# Patient Record
Sex: Female | Born: 2000
Health system: Southern US, Community
[De-identification: ages and names within clinical notes are randomized; demographics above are authoritative.]

## PROBLEM LIST (undated history)

## (undated) DIAGNOSIS — F419 Anxiety disorder, unspecified: Secondary | ICD-10-CM

## (undated) HISTORY — DX: Anxiety disorder, unspecified: F41.9

---
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
## 2021-08-27 ENCOUNTER — Ambulatory Visit: Payer: Medicaid Other

## 2021-08-28 ENCOUNTER — Other Ambulatory Visit: Payer: Self-pay

## 2021-08-28 ENCOUNTER — Ambulatory Visit (HOSPITAL_COMMUNITY)
Admission: EM | Admit: 2021-08-28 | Discharge: 2021-08-28 | Disposition: A | Discharge status: Home / Self Care | Payer: Medicaid Other | Attending: Emergency Medicine | Admitting: Emergency Medicine

## 2021-08-28 ENCOUNTER — Encounter (HOSPITAL_COMMUNITY): Payer: Self-pay | Admitting: Emergency Medicine

## 2021-08-28 DIAGNOSIS — R112 Nausea with vomiting, unspecified: Secondary | ICD-10-CM | POA: Diagnosis not present

## 2021-08-28 DIAGNOSIS — F1290 Cannabis use, unspecified, uncomplicated: Secondary | ICD-10-CM

## 2021-08-28 LAB — POCT URINALYSIS DIPSTICK, ED / UC
Bilirubin Urine: NEGATIVE
Glucose, UA: NEGATIVE mg/dL
Ketones, ur: NEGATIVE mg/dL
Leukocytes,Ua: NEGATIVE
Nitrite: NEGATIVE
Protein, ur: NEGATIVE mg/dL
Specific Gravity, Urine: 1.015 (ref 1.005–1.030)
Urobilinogen, UA: 0.2 mg/dL (ref 0.0–1.0)
pH: 7.5 (ref 5.0–8.0)

## 2021-08-28 LAB — CBG MONITORING, ED: Glucose-Capillary: 109 mg/dL — ABNORMAL HIGH (ref 70–99)

## 2021-08-28 LAB — POC URINE PREG, ED: Preg Test, Ur: NEGATIVE

## 2021-08-28 MED ORDER — ONDANSETRON HCL 4 MG PO TABS: 4.0000 mg | ORAL_TABLET | Freq: Four times a day (QID) | ORAL | 0 refills | Status: DC | PRN

## 2021-08-28 NOTE — ED Triage Notes (Signed)
Complains of weakness and nausea for 1 - 2 weeks.  Patient woke with headache today.

## 2021-08-28 NOTE — ED Provider Notes (Signed)
[facility]    CSN: [id number] Arrival date & time: 08/28/21  1548     History   Chief Complaint Chief Complaint  Patient presents with   Weakness    HPI Rebecca Reyes is a 21 y.o. female.  Presents with 2-week history of nausea and vomiting. Reports heavy daily marijuana use.  She has been trying to cut back recently. Constant nausea throughout the day, reports vomiting about 5 times over the past week.  Non bloody non-bilious.  Does not notice association with foods.  Some epigastric abdominal discomfort. Occasional weakness. She tried an aspirin for headache but threw it up.  Denies fever, chills, congestion, cough, sore throat, chest pain, shortness of breath, diarrhea/constipation, rash, urinary symptoms, vaginal bleeding or spotting. LMP ended 7/4. Pregnancy possible, she did take a test at home that was negative. Recently stopped taking her birth control pills  History reviewed. No pertinent past medical history.  Patient Active Problem List   Diagnosis Date Noted   Depression 03/01/2020    History reviewed. No pertinent surgical history.  OB History   No obstetric history on file.      Home Medications    Prior to Admission medications   Medication Sig Start Date End Date Taking? Authorizing Provider  ondansetron (ZOFRAN) 4 MG tablet Take 1 tablet (4 mg total) by mouth every 6 (six) hours as needed for nausea or vomiting. 08/28/21  Yes Niv Darley, Lurena Joiner, PA-C  escitalopram (LEXAPRO) 20 MG tablet Take 1 tablet (20 mg total) by mouth daily. Patient not taking: Reported on 10/19/2020 02/18/20   Grayce Sessions, NP    Family History History reviewed. No pertinent family history.  Social History Social History   Tobacco Use   Smoking status: Never   Smokeless tobacco: Never  Vaping Use   Vaping Use: Never used  Substance Use Topics   Alcohol use: No   Drug use: Yes    Types: Marijuana     Allergies   Patient has no known  allergies.   Review of Systems Review of Systems  Gastrointestinal:  Positive for nausea and vomiting.   Per HPI  Physical Exam Triage Vital Signs ED Triage Vitals  Enc Vitals Group     BP 08/28/21 1619 131/84     Pulse Rate 08/28/21 1619 92     Resp 08/28/21 1619 18     Temp 08/28/21 1619 98.4 F (36.9 C)     Temp Source 08/28/21 1619 Oral     SpO2 08/28/21 1619 98 %     Weight --      Height --      Head Circumference --      Peak Flow --      Pain Score 08/28/21 1616 4     Pain Loc --      Pain Edu? --      Excl. in GC? --    No data found.  Updated Vital Signs BP 131/84 (BP Location: Left Arm)   Pulse 92   Temp 98.4 F (36.9 C) (Oral)   Resp 18   LMP 08/10/2021   SpO2 98%    Physical Exam Vitals and nursing note reviewed.  Constitutional:      Appearance: Normal appearance.  HENT:     Mouth/Throat:     Mouth: Mucous membranes are moist.     Pharynx: Oropharynx is clear.  Eyes:     Conjunctiva/sclera: Conjunctivae normal.     Pupils: Pupils are equal, round,  and reactive to light.  Cardiovascular:     Rate and Rhythm: Normal rate and regular rhythm.     Heart sounds: Normal heart sounds.  Pulmonary:     Effort: Pulmonary effort is normal. No respiratory distress.     Breath sounds: Normal breath sounds.  Abdominal:     General: Bowel sounds are normal.     Palpations: Abdomen is soft.     Tenderness: There is abdominal tenderness in the epigastric area. There is no right CVA tenderness, left CVA tenderness, guarding or rebound. Negative signs include Murphy's sign, Rovsing's sign and McBurney's sign.     Comments: Some mild epigastric discomfort  Musculoskeletal:        General: Normal range of motion.  Skin:    General: Skin is warm and dry.  Neurological:     General: No focal deficit present.     Mental Status: She is alert and oriented to person, place, and time.     Motor: No weakness.     UC Treatments / Results  Labs (all labs  ordered are listed, but only abnormal results are displayed) Labs Reviewed  POCT URINALYSIS DIPSTICK, ED / UC - Abnormal; Notable for the following components:      Result Value   Hgb urine dipstick LARGE (*)    All other components within normal limits  CBG MONITORING, ED - Abnormal; Notable for the following components:   Glucose-Capillary 109 (*)    All other components within normal limits  POC URINE PREG, ED    EKG  Radiology No results found.  Procedures Procedures   Medications Ordered in UC Medications - No data to display  Initial Impression / Assessment and Plan / UC Course  I have reviewed the triage vital signs and the nursing notes.  Pertinent labs & imaging results that were available during my care of the patient were reviewed by me and considered in my medical decision making (see chart for details).  Blood glucose 109. Urinalysis negative apart from large hgb. Urine pregnancy negative.  Most likely cannabis hyperemesis syndrome. We discussed cutting back on marijuana use or take some days off and see if symptoms improve. Does not seem to be stomach bug/infectious cause at this time. No signs of acute abdomen. Try zofran at home as needed, ibuprofen if she has headache. Increase fluid intake as well. Follow up with primary care provider regarding symptoms and hematuria. Strict return/ED precautions.  Patient agrees to plan and she is discharged in stable condition.  Final Clinical Impressions(s) / UC Diagnoses   Final diagnoses:  Cannabinoid hyperemesis syndrome     Discharge Instructions      I believe your symptoms are due to heavy marijuana use. If you are able to stop smoking, you may see improvement in the nausea and vomiting.  In the meantime, increase your fluid intake. You can take the zofran every 6 hours for nausea. Take 30 min before a meal to help settle the stomach.  Please follow up with your primary care provider if symptoms persist. Any  worsening symptoms, please go to the emergency department.      ED Prescriptions     Medication Sig Dispense Auth. Provider   ondansetron (ZOFRAN) 4 MG tablet Take 1 tablet (4 mg total) by mouth every 6 (six) hours as needed for nausea or vomiting. 30 tablet Dail Meece, Lurena Joiner, PA-C      PDMP not reviewed this encounter.   Florena Kozma, Lurena Joiner, [place] 08/28/21 1709

## 2021-08-28 NOTE — Discharge Instructions (Addendum)
I believe your symptoms are due to heavy marijuana use. If you are able to stop smoking, you may see improvement in the nausea and vomiting.  In the meantime, increase your fluid intake. You can take the zofran every 6 hours for nausea. Take 30 min before a meal to help settle the stomach.  Please follow up with your primary care provider if symptoms persist. Any worsening symptoms, please go to the emergency department.

## 2021-08-31 ENCOUNTER — Encounter (INDEPENDENT_AMBULATORY_CARE_PROVIDER_SITE_OTHER): Payer: Self-pay | Admitting: Primary Care

## 2021-08-31 ENCOUNTER — Ambulatory Visit (INDEPENDENT_AMBULATORY_CARE_PROVIDER_SITE_OTHER): Payer: Medicaid Other | Admitting: Primary Care

## 2021-08-31 VITALS — BP 116/74 | HR 79 | Temp 98.1°F | Wt 157.0 lb

## 2021-08-31 DIAGNOSIS — R112 Nausea with vomiting, unspecified: Secondary | ICD-10-CM | POA: Diagnosis not present

## 2021-08-31 DIAGNOSIS — K5900 Constipation, unspecified: Secondary | ICD-10-CM | POA: Diagnosis not present

## 2021-08-31 DIAGNOSIS — Z3202 Encounter for pregnancy test, result negative: Secondary | ICD-10-CM | POA: Diagnosis not present

## 2021-08-31 LAB — POCT URINE PREGNANCY: Preg Test, Ur: NEGATIVE

## 2021-08-31 MED ORDER — SENNOSIDES 8.6 MG PO TABS: 1.0000 | ORAL_TABLET | Freq: Every day | ORAL | 1 refills | Status: DC

## 2021-08-31 NOTE — Patient Instructions (Addendum)
Constipation, Adult Constipation is when a person has fewer than three bowel movements in a week, has difficulty having a bowel movement, or has stools (feces) that are dry, hard, or larger than normal. Constipation may be caused by an underlying condition. It may become worse with age if a person takes certain medicines and does not take in enough fluids. Follow these instructions at home: Eating and drinking  Eat foods that have a lot of fiber, such as beans, whole grains, and fresh fruits and vegetables. Limit foods that are low in fiber and high in fat and processed sugars, such as fried or sweet foods. These include french fries, hamburgers, cookies, candies, and soda. Drink enough fluid to keep your urine pale yellow. General instructions Exercise regularly or as told by your health care provider. Try to do 150 minutes of moderate exercise each week. Use the bathroom when you have the urge to go. Do not hold it in. Take over-the-counter and prescription medicines only as told by your health care provider. This includes any fiber supplements. During bowel movements: Practice deep breathing while relaxing the lower abdomen. Practice pelvic floor relaxation. Watch your condition for any changes. Let your health care provider know about them. Keep all follow-up visits as told by your health care provider. This is important. Contact a health care provider if: You have pain that gets worse. You have a fever. You do not have a bowel movement after 4 days. You vomit. You are not hungry or you lose weight. You are bleeding from the opening between the buttocks (anus). You have thin, pencil-like stools. Get help right away if: You have a fever and your symptoms suddenly get worse. You leak stool or have blood in your stool. Your abdomen is bloated. You have severe pain in your abdomen. You feel dizzy or you faint. Summary Constipation is when a person has fewer than three bowel movements  in a week, has difficulty having a bowel movement, or has stools (feces) that are dry, hard, or larger than normal. Eat foods that have a lot of fiber, such as beans, whole grains, and fresh fruits and vegetables. Drink enough fluid to keep your urine pale yellow. Take over-the-counter and prescription medicines only as told by your health care provider. This includes any fiber supplements. This information is not intended to replace advice given to you by your health care provider. Make sure you discuss any questions you have with your health care provider. Document Revised: 12/18/2018 Document Reviewed: 12/18/2018 Elsevier Patient Education  2023 Else[employer] Inc. Nausea, Adult Nausea is feeling like you may vomit. Feeling like you may vomit is usually not serious, but it may be an early sign of a more serious medical problem. Vomiting is when stomach contents forcefully come out of your mouth. If you vomit, or if you are not able to drink enough fluids, you may not have enough water in your body (get dehydrated). If you do not have enough water in your body, you may: Feel tired. Feel thirsty. Have a dry mouth. Have cracked lips. Pee (urinate) less often. Older adults and people who have other diseases or a weak body defense system (immune system) have a higher risk of not having enough water in the body. The main goals of treating this condition are: To relieve your nausea. To ensure your nausea occurs less often. To prevent vomiting and losing too much fluid. Follow these instructions at home: Watch your symptoms for any changes. Tell your doctor about  them. Eating and drinking     Take an ORS (oral rehydration solution). This is a drink that is sold at pharmacies and stores. Drink clear fluids in small amounts as you are able. These include: Water. Ice chips. Fruit juice that has water added (diluted fruit juice). Low-calorie sports drinks. Eat bland, easy-to-digest foods in small  amounts as you are able, such as: Bananas. Applesauce. Rice. Low-fat (lean) meats. Toast. Crackers. Avoid drinking fluids that have a lot of sugar or caffeine in them. This includes energy drinks, sports drinks, and soda. Avoid alcohol. Avoid spicy or fatty foods. General instructions Take over-the-counter and prescription medicines only as told by your doctor. Rest at home while you get better. Drink enough fluid to keep your pee (urine) pale yellow. Take slow and deep breaths when you feel like you may vomit. Avoid food or things that have strong smells. Wash your hands often with soap and water for at least 20 seconds. If you cannot use soap and water, use hand sanitizer. Make sure that everyone in your home washes their hands well and often. Keep all follow-up visits. Contact a doctor if: You feel worse. You feel like you may vomit and this lasts for more than 2 days. You vomit. You are not able to drink fluids without vomiting. You have new symptoms. You have a fever. You have a headache. You have muscle cramps. You have a rash. You have pain while peeing. You feel light-headed or dizzy. Get help right away if: You have pain in your chest, neck, arm, or jaw. You feel very weak or you faint. You have vomit that is bright red or looks like coffee grounds. You have bloody or black poop (stools) or poop that looks like tar. You have a very bad headache, a stiff neck, or both. You have very bad pain, cramping, or bloating in your belly (abdomen). You have trouble breathing or you are breathing very quickly. Your heart is beating very quickly. Your skin feels cold and clammy. You feel confused. You have signs of losing too much water in your body, such as: Dark pee, very little pee, or no pee. Cracked lips. Dry mouth. Sunken eyes. Sleepiness. Weakness. These symptoms may be an emergency. Get help right away. Call 911. Do not wait to see if the symptoms will go  away. Do not drive yourself to the hospital. Summary Nausea is feeling like you are about vomit. If you vomit, or if you are not able to drink enough fluids, you may not have enough water in your body (get dehydrated). Eat and drink what your doctor tells you. Take over-the-counter and prescription medicines only as told by your doctor. Contact a doctor right away if your symptoms get worse or you have new symptoms. Keep all follow-up visits. This information is not intended to replace advice given to you by your health care provider. Make sure you discuss any questions you have with your health care provider. Document Revised: 08/06/2020 Document Reviewed: 08/06/2020 Elsevier Patient Education  2023 [employer].

## 2021-09-10 NOTE — Progress Notes (Signed)
[facility]  Dublin Cantero, is a 21 y.o. female  IHK:[id number]  VFI:[id number]  DOB - 2001/02/02  Chief Complaint  Patient presents with   Follow-up    Dizziness, feeling weak, migraines,vomiting, pain on left lower side x1 week       Subjective:   Nickola Lenig is a 21 y.o. female here today for a follow up visit. Patient has No headache, No chest pain, No abdominal pain - No Nausea, No new weakness tingling or numbness, No Cough - SOB.  No problems updated.  ALLERGIES: No Known Allergies  PAST MEDICAL HISTORY: History reviewed. No pertinent past medical history.  MEDICATIONS AT HOME: Prior to Admission medications   Medication Sig Start Date End Date Taking? Authorizing Provider  ELLA 30 MG tablet Take 1 tablet by mouth once. 08/23/21  Yes [provider]  senna (SENOKOT) 8.6 MG tablet Take 1 tablet (8.6 mg total) by mouth daily. 08/31/21  Yes Grayce Sessions, NP  escitalopram (LEXAPRO) 20 MG tablet Take 1 tablet (20 mg total) by mouth daily. Patient not taking: Reported on 10/19/2020 02/18/20   Grayce Sessions, NP  ondansetron (ZOFRAN) 4 MG tablet Take 1 tablet (4 mg total) by mouth every 6 (six) hours as needed for nausea or vomiting. Patient not taking: Reported on 08/31/2021 08/28/21   Rising, Lurena Joiner, PA-C    Objective:   Vitals:   08/31/21 1531  BP: 116/74  Pulse: 79  Temp: 98.1 F (36.7 C)  TempSrc: Oral  SpO2: 97%  Weight: 157 lb (71.2 kg)   Exam General appearance : Awake, alert, not in any distress. Speech Clear. Not toxic looking HEENT: Atraumatic and Normocephalic, pupils equally reactive to light and accomodation Neck: Supple, no JVD. No cervical lymphadenopathy.  Chest: Good air entry bilaterally, no added sounds  CVS: S1 S2 regular, no murmurs.  Abdomen: Bowel sounds present, Non tender and not distended with no gaurding, rigidity or rebound. Extremities: B/L Lower Ext shows no edema, both legs are warm to  touch Neurology: Awake alert, and oriented X 3, CN II-XII intact, Non focal Skin: No Rash  Data Review No results found for: "HGBA1C"  Assessment & Plan   1. Nausea and vomiting, unspecified vomiting type BRATS diet -bananas, rice apples, toast and soups this will lower easy digestion and stomach to relax. No red flags present. Diet discussed. Avoid fried, spicy, fatty, greasy, and acidic foods. Avoid caffeine, nicotine, and alcohol. Do not eat 2-3 hours before bedtime and stay upright for at least 1-2 hours after eating. Eat small frequent meals. Avoid NSAID's like motrin and aleve. Medications as prescribed. Report any new or worsening symptoms. Follow up as discussed or sooner if needed.    - POCT urine pregnancy Negative  2. Constipation, unspecified constipation type Information placed on AVS MANAGEMENT OF CHRONIC CONSTIPATION Drink fluids in the recommended amount everyday. Recommend amount is 8 cups of water daily. Do not replace water with Gatorade or Powerade as these should only be used when you are dehydrated.  Eat lots of high fiber foods-fruits, veggies, bran and whole grain instead of white bread Be active everyday. Inactivity makes constipation worse. Add psyllium daily (Metamucil) which comes in capsules now. Start very low dose and work up to recommended dose on bottle daily. Stay away from Milk of Magnesia or any magnesium containing laxative, unless you need it to clear things out rarely. It is an addictive laxative and your gut will become dependent on it. If that is  not working, I would start Miralax, which you can buy in generic 17 gms daily. It's a powder and not an "addictive laxative". Take it every day and titrate the dose up or down to get the daily Bm. We will consider the use of other pharmacological treatments should the above recommendations prove to be unsuccessful.   senna (SENOKOT) 8.6 MG      Patient have been counseled extensively about nutrition and  exercise. Other issues discussed during this visit include: low cholesterol diet, weight control and daily exercise, foot care, annual eye examinations at Ophthalmology, importance of adherence with medications and regular follow-up. We also discussed long term complications of uncontrolled diabetes and hypertension.   Return in about 6 weeks (around 10/12/2021) for depression .  The patient was given clear instructions to go to ER or return to medical center if symptoms don't improve, worsen or new problems develop. The patient verbalized understanding. The patient was told to call to get lab results if they haven't heard anything in the next week.   This note has been created with Education officer, environmental. Any transcriptional errors are unintentional.   Grayce Sessions, NP 09/10/2021, 4:52 PM

## 2021-09-29 ENCOUNTER — Emergency Department (HOSPITAL_COMMUNITY)
Admission: EM | Admit: 2021-09-29 | Discharge: 2021-09-29 | Discharge status: Against Medical Advice | Payer: Medicaid Other | Attending: Emergency Medicine | Admitting: Emergency Medicine

## 2021-09-29 ENCOUNTER — Encounter (HOSPITAL_COMMUNITY): Payer: Self-pay | Admitting: Emergency Medicine

## 2021-09-29 ENCOUNTER — Emergency Department (HOSPITAL_COMMUNITY): Payer: Medicaid Other

## 2021-09-29 DIAGNOSIS — Z5321 Procedure and treatment not carried out due to patient leaving prior to being seen by health care provider: Secondary | ICD-10-CM | POA: Insufficient documentation

## 2021-09-29 DIAGNOSIS — K5900 Constipation, unspecified: Secondary | ICD-10-CM | POA: Diagnosis not present

## 2021-09-29 DIAGNOSIS — R110 Nausea: Secondary | ICD-10-CM | POA: Diagnosis not present

## 2021-09-29 DIAGNOSIS — R0789 Other chest pain: Secondary | ICD-10-CM | POA: Insufficient documentation

## 2021-09-29 LAB — CBC
HCT: 42.9 % (ref 36.0–46.0)
Hemoglobin: 14.1 g/dL (ref 12.0–15.0)
MCH: 28.3 pg (ref 26.0–34.0)
MCHC: 32.9 g/dL (ref 30.0–36.0)
MCV: 86 fL (ref 80.0–100.0)
Platelets: 353 10*3/uL (ref 150–400)
RBC: 4.99 MIL/uL (ref 3.87–5.11)
RDW: 12.5 % (ref 11.5–15.5)
WBC: 3.9 10*3/uL — ABNORMAL LOW (ref 4.0–10.5)
nRBC: 0 % (ref 0.0–0.2)

## 2021-09-29 LAB — BASIC METABOLIC PANEL
Anion gap: 8 (ref 5–15)
BUN: 7 mg/dL (ref 6–20)
CO2: 26 mmol/L (ref 22–32)
Calcium: 10 mg/dL (ref 8.9–10.3)
Chloride: 103 mmol/L (ref 98–111)
Creatinine, Ser: 0.74 mg/dL (ref 0.44–1.00)
GFR, Estimated: >60 mL/min (ref >60)
Glucose, Bld: 95 mg/dL (ref 70–99)
Potassium: 4.1 mmol/L (ref 3.5–5.1)
Sodium: 137 mmol/L (ref 135–145)

## 2021-09-29 LAB — I-STAT BETA HCG BLOOD, ED (MC, WL, AP ONLY): I-stat hCG, quantitative: <5 m[IU]/mL (ref <5)

## 2021-09-29 LAB — TROPONIN I (HIGH SENSITIVITY): Troponin I (High Sensitivity): 3 ng/L (ref <18)

## 2021-09-29 NOTE — ED Triage Notes (Addendum)
Patient w/ left sided chest pain, intermittent for about a month however patient states when waking up the pain felt more constant and had to leave work. Patient describes it as a pressure. Denies SHOB. Patient endorses nausea today at work.  Aox4. No apparent distress at this time. Denies any new meds however took laxative yesterday because she initially thought this was constipation.

## 2021-09-29 NOTE — ED Provider Triage Note (Signed)
Emergency Medicine Provider Triage Evaluation Note  [name] , a 21 y.o. female  was evaluated in triage.  Pt complains of left lower chest pain for the past month. She denies any SOB or any trauma. She denies any exacerbating factors.  Review of Systems  Positive:  Negative:   Physical Exam  BP 127/81 (BP Location: Right Arm)   Pulse 71   Temp 98.7 F (37.1 C) (Oral)   Resp 16   SpO2 98%  Gen:   Awake, no distress   Resp:  Normal effort  MSK:   Moves extremities without difficulty  Other:  Chest non tender. NAD. Lungs are clear.  Medical Decision Making  Medically screening exam initiated at 1:59 PM.  Appropriate orders placed.  NARIAH MORGANO was informed that the remainder of the evaluation will be completed by another provider, this initial triage assessment does not replace that evaluation, and the importance of remaining in the ED until their evaluation is complete.  NAD. Vitals stable. Chest pain work up added.    Achille Rich, PA-C 09/29/21 1400

## 2021-09-29 NOTE — ED Notes (Signed)
Pt went home.

## 2021-10-12 ENCOUNTER — Ambulatory Visit (INDEPENDENT_AMBULATORY_CARE_PROVIDER_SITE_OTHER): Payer: Medicaid Other | Admitting: Primary Care

## 2021-10-12 ENCOUNTER — Encounter (INDEPENDENT_AMBULATORY_CARE_PROVIDER_SITE_OTHER): Payer: Self-pay | Admitting: Primary Care

## 2021-10-12 VITALS — BP 121/81 | HR 70 | Temp 98.5°F | Ht 61.0 in | Wt 164.2 lb

## 2021-10-12 DIAGNOSIS — F411 Generalized anxiety disorder: Secondary | ICD-10-CM | POA: Diagnosis not present

## 2021-10-12 DIAGNOSIS — R41840 Attention and concentration deficit: Secondary | ICD-10-CM

## 2021-10-12 MED ORDER — BUSPIRONE HCL 5 MG PO TABS: 5.0000 mg | ORAL_TABLET | Freq: Two times a day (BID) | ORAL | 1 refills | Status: DC

## 2021-10-12 NOTE — Progress Notes (Signed)
       [facility]  Subjective:    [name] is a 21 y.o. female who presents for new evaluation and treatment of anxiety disorder and sleep disturbance. She has the following anxiety symptoms: difficulty concentrating, insomnia, irritable, palpitations, psychomotor agitation, racing thoughts, and sweating. Onset of symptoms was approximately 3 years ago. Symptoms have been gradually worsening since that time. She denies current suicidal and homicidal ideation. Family history significant for depression and substance abuse. Risk factors: positive family history in  aunt. Maternal.Previous treatment includes  none .  She is also concerned that she has lack of concentration and unable to complete a task.  Examples were given was cleaning her house she will start cleaning the kitchen, then realize she needed to wash clothes, then start vacuuming or sweeping and mopping.  She has spent the whole day cleaning and house and not 1 room had been cleaning completely. Patient has No headache, No chest pain, No abdominal pain - No Nausea, No new weakness tingling or numbness, No Cough - shortness of breath   Review of Systems Pertinent items noted in HPI and remainder of comprehensive ROS otherwise negative.    Objective:  BP 121/81   Pulse 70   Temp 98.5 F (36.9 C) (Oral)   Ht 5\' 1"  (1.549 m)   Wt 164 lb 3.2 oz (74.5 kg)   LMP 09/17/2021 (Exact Date)   SpO2 98%   BMI 31.03 kg/m      Assessment:  Rebecca Reyes was seen today for follow-up.  Diagnoses and all orders for this visit:  Lack of concentration Evaluation for ADD -     Ambulatory referral to Psychiatry  Generalized anxiety disorder We discussed options for treatment of anxiety including therapy and/or medication. Discussed potential risks, expected benefits, possible side effects of the medicine. We also discussed how to take it correctly and dosing instructions. If she has any significant side effects to the  medicine, she is to stop it and call for advice. Instructed patient to contact office or on-call physician promptly should condition worsen or any new symptoms appear.   She was agreeable with this plan.  Prescribed BuSpar 5 mg twice daily information on AVS provided reevaluate in at least 6 weeks.    This note has been created with Watt Climes. Any transcriptional errors are unintentional.   Education officer, environmental, NP 10/12/2021, 11:53 AM

## 2021-10-12 NOTE — Patient Instructions (Signed)
Buspirone Tablets What is this medication? BUSPIRONE (byoo SPYE rone) treats anxiety. It works by balancing the levels of dopamine and serotonin in your brain, hormones that help regulate mood. This medicine may be used for other purposes; ask your health care provider or pharmacist if you have questions. COMMON BRAND NAME(S): BuSpar, Buspar Dividose What should I tell my care team before I take this medication? They need to know if you have any of these conditions: Kidney or liver disease An unusual or allergic reaction to buspirone, other medications, foods, dyes, or preservatives Pregnant or trying to get pregnant Breast-feeding How should I use this medication? Take this medication by mouth with a glass of water. Follow the directions on the prescription label. You may take this medication with or without food. To ensure that this medication always works the same way for you, you should take it either always with or always without food. Take your doses at regular intervals. Do not take your medication more often than directed. Do not stop taking except on the advice of your care team. Talk to your care team about the use of this medication in children. Special care may be needed. Overdosage: If you think you have taken too much of this medicine contact a poison control center or emergency room at once. NOTE: This medicine is only for you. Do not share this medicine with others. What if I miss a dose? If you miss a dose, take it as soon as you can. If it is almost time for your next dose, take only that dose. Do not take double or extra doses. What may interact with this medication? Do not take this medication with any of the following: Linezolid MAOIs like Carbex, Eldepryl, Marplan, Nardil, and Parnate Methylene blue Procarbazine This medication may also interact with the following: Diazepam Digoxin Diltiazem Erythromycin Grapefruit juice Haloperidol Medications for mental  depression or mood problems Medications for seizures like carbamazepine, phenobarbital and phenytoin Nefazodone Other medications for anxiety Rifampin Ritonavir Some antifungal medications like itraconazole, ketoconazole, and voriconazole Verapamil Warfarin This list may not describe all possible interactions. Give your health care provider a list of all the medicines, herbs, non-prescription drugs, or dietary supplements you use. Also tell them if you smoke, drink alcohol, or use illegal drugs. Some items may interact with your medicine. What should I watch for while using this medication? Visit your care team for regular checks on your progress. It may take 1 to 2 weeks before your anxiety gets better. You may get drowsy or dizzy. Do not drive, use machinery, or do anything that needs mental alertness until you know how this medication affects you. Do not stand or sit up quickly, especially if you are an older patient. This reduces the risk of dizzy or fainting spells. Alcohol can make you more drowsy and dizzy. Avoid alcoholic drinks. What side effects may I notice from receiving this medication? Side effects that you should report to your care team as soon as possible: Allergic reactions--skin rash, itching, hives, swelling of the face, lips, tongue, or throat Irritability, confusion, fast or irregular heartbeat, muscle stiffness, twitching muscles, sweating, high fever, seizure, chills, vomiting, diarrhea, which may be signs of serotonin syndrome Side effects that usually do not require medical attention (report to your care team if they continue or are bothersome): Anxiety or nervousness Dizziness Drowsiness Headache Nausea Trouble sleeping This list may not describe all possible side effects. Call your doctor for medical advice about side effects. You may report   side effects to FDA at 1-800-FDA-1088. Where should I keep my medication? Keep out of the reach of children. Store at room  temperature below 30 degrees C (86 degrees F). Protect from light. Keep container tightly closed. Throw away any unused medication after the expiration date. NOTE: This sheet is a summary. It may not cover all possible information. If you have questions about this medicine, talk to your doctor, pharmacist, or health care provider.  2023 Elsevier/Gold Standard (2020-04-12 00:00:00)  

## 2021-10-21 ENCOUNTER — Encounter (INDEPENDENT_AMBULATORY_CARE_PROVIDER_SITE_OTHER): Payer: Medicaid Other | Admitting: Primary Care

## 2021-11-23 ENCOUNTER — Ambulatory Visit (INDEPENDENT_AMBULATORY_CARE_PROVIDER_SITE_OTHER): Payer: Medicaid Other | Admitting: Primary Care

## 2021-12-07 ENCOUNTER — Ambulatory Visit (HOSPITAL_COMMUNITY): Payer: Self-pay | Admitting: Student

## 2022-01-03 ENCOUNTER — Ambulatory Visit (INDEPENDENT_AMBULATORY_CARE_PROVIDER_SITE_OTHER): Payer: Medicaid Other | Admitting: Primary Care

## 2022-01-04 ENCOUNTER — Ambulatory Visit (HOSPITAL_COMMUNITY): Payer: Medicaid Other | Admitting: Student

## 2022-01-26 ENCOUNTER — Ambulatory Visit (INDEPENDENT_AMBULATORY_CARE_PROVIDER_SITE_OTHER): Payer: Medicaid Other | Admitting: Primary Care
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
## 2022-01-26 NOTE — Patient Instructions (Signed)
Thank you for attending your appointment today.  -- START Effexor 37.5 mg daily -- START Atarax 12.5-25 mg up to twice daily as needed for anxiety or sleep -- Continue other medications as prescribed.  Please do not make any changes to medications without first discussing with your provider. If you are experiencing a psychiatric emergency, please call 911 or present to your nearest emergency department. Additional crisis, medication management, and therapy resources are included below.  [facility]  [address] [place], [place] [address] [phone] WALK-IN URGENT CARE 24/7 FOR ANYONE [address], [place], [place]  [phone] Fax: [phone] [email or web address] *Interpreters available *Accepts all insurance and uninsured for Urgent Care needs *Accepts Medicaid and uninsured for outpatient treatment (below)      ONLY FOR [facility]  Below:    Outpatient New Patient Assessment/Therapy Walk-ins:        Monday -Thursday 8am until slots are full.        Every Friday 1pm-4pm  (first come, first served)                   New Patient Psychiatry/Medication Management        Monday-Friday 8am-11am (first come, first served)               For all walk-ins we ask that you arrive by 7:15am, because patients will be seen in the order of arrival.

## 2022-01-27 ENCOUNTER — Ambulatory Visit (INDEPENDENT_AMBULATORY_CARE_PROVIDER_SITE_OTHER): Payer: No Payment, Other | Admitting: Psychiatry

## 2022-01-27 ENCOUNTER — Encounter (HOSPITAL_COMMUNITY): Payer: Self-pay | Admitting: Psychiatry

## 2022-01-27 ENCOUNTER — Other Ambulatory Visit: Payer: Self-pay

## 2022-01-27 DIAGNOSIS — F411 Generalized anxiety disorder: Secondary | ICD-10-CM | POA: Diagnosis not present

## 2022-01-27 DIAGNOSIS — F410 Panic disorder [episodic paroxysmal anxiety] without agoraphobia: Secondary | ICD-10-CM | POA: Diagnosis not present

## 2022-01-27 MED ORDER — VENLAFAXINE HCL ER 37.5 MG PO CP24
37.5000 mg | ORAL_CAPSULE | Freq: Every day | ORAL | 2 refills | Status: DC
  Filled 2022-01-27: qty 30, 30d supply, fill #0

## 2022-01-27 MED ORDER — HYDROXYZINE HCL 25 MG PO TABS
12.5000 mg | ORAL_TABLET | Freq: Two times a day (BID) | ORAL | 2 refills | Status: DC | PRN
  Filled 2022-01-27: qty 60, 30d supply, fill #0

## 2022-02-02 ENCOUNTER — Encounter (INDEPENDENT_AMBULATORY_CARE_PROVIDER_SITE_OTHER): Payer: Self-pay | Admitting: Primary Care

## 2022-02-02 ENCOUNTER — Ambulatory Visit (INDEPENDENT_AMBULATORY_CARE_PROVIDER_SITE_OTHER): Payer: Self-pay | Admitting: Primary Care

## 2022-02-02 VITALS — BP 126/84 | HR 98 | Ht 61.0 in | Wt 165.8 lb

## 2022-02-02 DIAGNOSIS — Z2821 Immunization not carried out because of patient refusal: Secondary | ICD-10-CM

## 2022-02-02 DIAGNOSIS — M25561 Pain in right knee: Secondary | ICD-10-CM

## 2022-02-02 DIAGNOSIS — E6609 Other obesity due to excess calories: Secondary | ICD-10-CM

## 2022-02-02 DIAGNOSIS — Z6830 Body mass index (BMI) 30.0-30.9, adult: Secondary | ICD-10-CM

## 2022-02-02 NOTE — Progress Notes (Signed)
Right knee pain up the sciatica. Otc Ibprofen 500mg  x2 daily.

## 2022-02-02 NOTE — Patient Instructions (Signed)
Calorie Counting for Weight Loss Calories are units of energy. Your body needs a certain number of calories from food to keep going throughout the day. When you eat or drink more calories than your body needs, your body stores the extra calories mostly as fat. When you eat or drink fewer calories than your body needs, your body burns fat to get the energy it needs. Calorie counting means keeping track of how many calories you eat and drink each day. Calorie counting can be helpful if you need to lose weight. If you eat fewer calories than your body needs, you should lose weight. Ask your health care provider what a healthy weight is for you. For calorie counting to work, you will need to eat the right number of calories each day to lose a healthy amount of weight per week. A dietitian can help you figure out how many calories you need in a day and will suggest ways to reach your calorie goal. A healthy amount of weight to lose each week is usually 1-2 lb (0.5-0.9 kg). This usually means that your daily calorie intake should be reduced by 500-750 calories. Eating 1,200-1,500 calories a day can help most women lose weight. Eating 1,500-1,800 calories a day can help most men lose weight. What do I need to know about calorie counting? Work with your health care provider or dietitian to determine how many calories you should get each day. To meet your daily calorie goal, you will need to: Find out how many calories are in each food that you would like to eat. Try to do this before you eat. Decide how much of the food you plan to eat. Keep a food log. Do this by writing down what you ate and how many calories it had. To successfully lose weight, it is important to balance calorie counting with a healthy lifestyle that includes regular activity. Where do I find calorie information?  The number of calories in a food can be found on a Nutrition Facts label. If a food does not have a Nutrition Facts label, try  to look up the calories online or ask your dietitian for help. Remember that calories are listed per serving. If you choose to have more than one serving of a food, you will have to multiply the calories per serving by the number of servings you plan to eat. For example, the label on a package of bread might say that a serving size is 1 slice and that there are 90 calories in a serving. If you eat 1 slice, you will have eaten 90 calories. If you eat 2 slices, you will have eaten 180 calories. How do I keep a food log? After each time that you eat, record the following in your food log as soon as possible: What you ate. Be sure to include toppings, sauces, and other extras on the food. How much you ate. This can be measured in cups, ounces, or number of items. How many calories were in each food and drink. The total number of calories in the food you ate. Keep your food log near you, such as in a pocket-sized notebook or on an app or website on your mobile phone. Some programs will calculate calories for you and show you how many calories you have left to meet your daily goal. What are some portion-control tips? Know how many calories are in a serving. This will help you know how many servings you can have of a certain   food. Use a measuring cup to measure serving sizes. You could also try weighing out portions on a kitchen scale. With time, you will be able to estimate serving sizes for some foods. Take time to put servings of different foods on your favorite plates or in your favorite bowls and cups so you know what a serving looks like. Try not to eat straight from a food's packaging, such as from a bag or box. Eating straight from the package makes it hard to see how much you are eating and can lead to overeating. Put the amount you would like to eat in a cup or on a plate to make sure you are eating the right portion. Use smaller plates, glasses, and bowls for smaller portions and to prevent  overeating. Try not to multitask. For example, avoid watching TV or using your computer while eating. If it is time to eat, sit down at a table and enjoy your food. This will help you recognize when you are full. It will also help you be more mindful of what and how much you are eating. What are tips for following this plan? Reading food labels Check the calorie count compared with the serving size. The serving size may be smaller than what you are used to eating. Check the source of the calories. Try to choose foods that are high in protein, fiber, and vitamins, and low in saturated fat, trans fat, and sodium. Shopping Read nutrition labels while you shop. This will help you make healthy decisions about which foods to buy. Pay attention to nutrition labels for low-fat or fat-free foods. These foods sometimes have the same number of calories or more calories than the full-fat versions. They also often have added sugar, starch, or salt to make up for flavor that was removed with the fat. Make a grocery list of lower-calorie foods and stick to it. Cooking Try to cook your favorite foods in a healthier way. For example, try baking instead of frying. Use low-fat dairy products. Meal planning Use more fruits and vegetables. One-half of your plate should be fruits and vegetables. Include lean proteins, such as chicken, turkey, and fish. Lifestyle Each week, aim to do one of the following: 150 minutes of moderate exercise, such as walking. 75 minutes of vigorous exercise, such as running. General information Know how many calories are in the foods you eat most often. This will help you calculate calorie counts faster. Find a way of tracking calories that works for you. Get creative. Try different apps or programs if writing down calories does not work for you. What foods should I eat?  Eat nutritious foods. It is better to have a nutritious, high-calorie food, such as an avocado, than a food with  few nutrients, such as a bag of potato chips. Use your calories on foods and drinks that will fill you up and will not leave you hungry soon after eating. Examples of foods that fill you up are nuts and nut butters, vegetables, lean proteins, and high-fiber foods such as whole grains. High-fiber foods are foods with more than 5 g of fiber per serving. Pay attention to calories in drinks. Low-calorie drinks include water and unsweetened drinks. The items listed above may not be a complete list of foods and beverages you can eat. Contact a dietitian for more information. What foods should I limit? Limit foods or drinks that are not good sources of vitamins, minerals, or protein or that are high in unhealthy fats. These   include: Candy. Other sweets. Sodas, specialty coffee drinks, alcohol, and juice. The items listed above may not be a complete list of foods and beverages you should avoid. Contact a dietitian for more information. How do I count calories when eating out? Pay attention to portions. Often, portions are much larger when eating out. Try these tips to keep portions smaller: Consider sharing a meal instead of getting your own. If you get your own meal, eat only half of it. Before you start eating, ask for a container and put half of your meal into it. When available, consider ordering smaller portions from the menu instead of full portions. Pay attention to your food and drink choices. Knowing the way food is cooked and what is included with the meal can help you eat fewer calories. If calories are listed on the menu, choose the lower-calorie options. Choose dishes that include vegetables, fruits, whole grains, low-fat dairy products, and lean proteins. Choose items that are boiled, broiled, grilled, or steamed. Avoid items that are buttered, battered, fried, or served with cream sauce. Items labeled as crispy are usually fried, unless stated otherwise. Choose water, low-fat milk,  unsweetened iced tea, or other drinks without added sugar. If you want an alcoholic beverage, choose a lower-calorie option, such as a glass of wine or light beer. Ask for dressings, sauces, and syrups on the side. These are usually high in calories, so you should limit the amount you eat. If you want a salad, choose a garden salad and ask for grilled meats. Avoid extra toppings such as bacon, cheese, or fried items. Ask for the dressing on the side, or ask for olive oil and vinegar or lemon to use as dressing. Estimate how many servings of a food you are given. Knowing serving sizes will help you be aware of how much food you are eating at restaurants. Where to find more information Centers for Disease Control and Prevention: www.cdc.gov U.[place]. Department of Agriculture: myplate.gov Summary Calorie counting means keeping track of how many calories you eat and drink each day. If you eat fewer calories than your body needs, you should lose weight. A healthy amount of weight to lose per week is usually 1-2 lb (0.5-0.9 kg). This usually means reducing your daily calorie intake by 500-750 calories. The number of calories in a food can be found on a Nutrition Facts label. If a food does not have a Nutrition Facts label, try to look up the calories online or ask your dietitian for help. Use smaller plates, glasses, and bowls for smaller portions and to prevent overeating. Use your calories on foods and drinks that will fill you up and not leave you hungry shortly after a meal. This information is not intended to replace advice given to you by your health care provider. Make sure you discuss any questions you have with your health care provider. Document Revised: 03/13/2019 Document Reviewed: 03/13/2019 Elsevier Patient Education  2023 Elsevier Inc.  

## 2022-02-02 NOTE — Progress Notes (Signed)
    [facility]          Subjective:    Rebecca Reyes is a 21 y.o. female who presents with knee pain involving the right knee. Onset was gradual, starting about 6 months ago. Inciting event: none known but aggravating factor standing for prolong periods of time. Current symptoms include: crepitus sensation, giving out, and popping sensation. Pain is aggravated by kneeling, pivoting, rising after sitting, squatting, standing, and walking. Patient has had no prior knee problems. Evaluation to date: none. Treatment to date: avoidance of offending activity, ice, and OTC analgesics which are somewhat effective.  The following portions of the patient's history were reviewed and updated as appropriate: allergies, current medications, past family history, past medical history, past social history, past surgical history, and problem list.   Review of Systems Pertinent items noted in HPI and remainder of comprehensive ROS otherwise negative.   Objective:    Blood Pressure 126/84   Pulse 98   Height 5\' 1"  (1.549 m)   Weight 165 lb 12.8 oz (75.2 kg)   Last Menstrual Period 01/28/2022 (Exact Date)   Oxygen Saturation 98%   Body Mass Index 31.33 kg/m  Physical exam: General: Vital signs reviewed.  Patient is well-developed and well-nourished, obese female  in no acute distress and cooperative with exam. Head: Normocephalic and atraumatic. Eyes: EOMI, conjunctivae normal, no scleral icterus. Neck: Supple, trachea midline, normal ROM, no JVD, masses, thyromegaly, or carotid bruit present. Cardiovascular: RRR, S1 normal, S2 normal, no murmurs, gallops, or rubs. Pulmonary/Chest: Clear to auscultation bilaterally, no wheezes, rales, or rhonchi. Abdominal: Soft, non-tender, non-distended, BS +, no masses, organomegaly, or guarding present. Musculoskeletal Extremities:Left knee negative exam and right knee positive for crepitus  Neurological: A&O x3, Strength is normal Skin:  Warm, dry and intact. No rashes or erythema. Psychiatric: Normal mood and affect. speech and behavior is normal. Cognition and memory are normal.    Assessment:   Rebecca Reyes was seen today for knee pain.  Diagnoses and all orders for this visit:  Human papilloma virus (HPV) vaccination declined  Right knee pain, unspecified chronicity Right knee pain up the sciatica.  Contributory factors may be weight encouraged weight loss and exercising.  May use over-the-counter ibuprofen 200 mg x 2 every 6-8 hours for pain as needed  Class 1 obesity due to excess calories without serious comorbidity with body mass index (BMI) of 30.0 to 30.9 in adult Obesity is 30-39 indicating an excess in caloric intake or underlining conditions. This may lead to other co-morbidities. Educated on lifestyle modifications of diet and exercise which may reduce obesity.      This note has been created with Watt Climes. Any transcriptional errors are unintentional.   Education officer, environmental, NP 02/02/2022, 4:24 PM

## 2022-02-08 ENCOUNTER — Ambulatory Visit: Payer: Medicaid Other | Admitting: Nurse Practitioner

## 2022-02-16 ENCOUNTER — Other Ambulatory Visit: Payer: Self-pay

## 2022-02-16 ENCOUNTER — Ambulatory Visit (INDEPENDENT_AMBULATORY_CARE_PROVIDER_SITE_OTHER): Payer: No Payment, Other | Admitting: Student

## 2022-02-16 ENCOUNTER — Encounter (HOSPITAL_COMMUNITY): Payer: Self-pay | Admitting: Student

## 2022-02-16 VITALS — BP 119/77 | HR 71 | Wt 162.4 lb

## 2022-02-16 DIAGNOSIS — F410 Panic disorder [episodic paroxysmal anxiety] without agoraphobia: Secondary | ICD-10-CM | POA: Diagnosis not present

## 2022-02-16 DIAGNOSIS — F411 Generalized anxiety disorder: Secondary | ICD-10-CM

## 2022-02-16 MED ORDER — VENLAFAXINE HCL ER 75 MG PO CP24
75.0000 mg | ORAL_CAPSULE | Freq: Every day | ORAL | 1 refills | Status: DC
  Filled 2022-02-16: qty 30, 30d supply, fill #0
  Filled 2022-04-01: qty 30, 30d supply, fill #1

## 2022-02-16 MED ORDER — HYDROXYZINE HCL 10 MG PO TABS
10.0000 mg | ORAL_TABLET | Freq: Three times a day (TID) | ORAL | 2 refills | Status: DC | PRN
  Filled 2022-02-16: qty 60, 20d supply, fill #0
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
## 2022-02-17 ENCOUNTER — Other Ambulatory Visit: Payer: Self-pay

## 2022-02-21 ENCOUNTER — Ambulatory Visit (INDEPENDENT_AMBULATORY_CARE_PROVIDER_SITE_OTHER): Payer: No Payment, Other | Admitting: Clinical

## 2022-02-21 DIAGNOSIS — F410 Panic disorder [episodic paroxysmal anxiety] without agoraphobia: Secondary | ICD-10-CM

## 2022-02-21 DIAGNOSIS — F411 Generalized anxiety disorder: Secondary | ICD-10-CM | POA: Diagnosis not present
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
## 2022-02-23 ENCOUNTER — Ambulatory Visit (INDEPENDENT_AMBULATORY_CARE_PROVIDER_SITE_OTHER): Payer: Self-pay | Admitting: Primary Care

## 2022-02-23 ENCOUNTER — Other Ambulatory Visit: Payer: Self-pay

## 2022-03-12 ENCOUNTER — Ambulatory Visit (INDEPENDENT_AMBULATORY_CARE_PROVIDER_SITE_OTHER): Payer: Self-pay

## 2022-03-12 ENCOUNTER — Encounter: Payer: Self-pay | Admitting: Emergency Medicine

## 2022-03-12 ENCOUNTER — Ambulatory Visit
Admission: EM | Admit: 2022-03-12 | Discharge: 2022-03-12 | Disposition: A | Discharge status: Home / Self Care | Payer: Self-pay | Attending: Nurse Practitioner | Admitting: Nurse Practitioner

## 2022-03-12 DIAGNOSIS — R112 Nausea with vomiting, unspecified: Secondary | ICD-10-CM | POA: Insufficient documentation

## 2022-03-12 DIAGNOSIS — Z1152 Encounter for screening for COVID-19: Secondary | ICD-10-CM | POA: Insufficient documentation

## 2022-03-12 DIAGNOSIS — R1084 Generalized abdominal pain: Secondary | ICD-10-CM

## 2022-03-12 DIAGNOSIS — N3000 Acute cystitis without hematuria: Secondary | ICD-10-CM | POA: Insufficient documentation

## 2022-03-12 DIAGNOSIS — K5900 Constipation, unspecified: Secondary | ICD-10-CM | POA: Insufficient documentation

## 2022-03-12 LAB — POCT URINALYSIS DIP (MANUAL ENTRY)
Bilirubin, UA: NEGATIVE
Blood, UA: NEGATIVE
Glucose, UA: NEGATIVE mg/dL
Ketones, POC UA: NEGATIVE mg/dL
Nitrite, UA: NEGATIVE
Spec Grav, UA: >=1.03 — AB (ref 1.010–1.025)
Urobilinogen, UA: 1 E.U./dL
pH, UA: 6 (ref 5.0–8.0)

## 2022-03-12 LAB — POCT URINE PREGNANCY: Preg Test, Ur: NEGATIVE

## 2022-03-12 LAB — POCT FASTING CBG KUC MANUAL ENTRY: POCT Glucose (KUC): 82 mg/dL (ref 70–99)

## 2022-03-12 MED ORDER — CEPHALEXIN 500 MG PO CAPS: 500.0000 mg | ORAL_CAPSULE | Freq: Two times a day (BID) | ORAL | 0 refills | Status: AC

## 2022-03-12 MED ORDER — ONDANSETRON HCL 4 MG PO TABS: 4.0000 mg | ORAL_TABLET | Freq: Three times a day (TID) | ORAL | 0 refills | Status: DC | PRN

## 2022-03-12 NOTE — ED Triage Notes (Signed)
Patient c/o blood sugars running low since Wednesday.  When patient does eat, she feels worse, vomiting.  Last blood sugar checked was on Wednesday, it was 74 non-fasting.

## 2022-03-12 NOTE — Discharge Instructions (Signed)
Keflex twice daily for 7 days.  I will culture your urine and contact you if those results are positive Zofran as needed for nausea/vomiting.  Please stay hydrated with over-the-counter Gatorade, Powerade, Pedialyte.  Brat diet (bread rice applesauce toast bananas) and advance as tolerated Take over-the-counter laxative such as MiraLAX daily. Follow-up with your PCP in 2 days for recheck Please go to the emergency room if you have any worsening symptoms

## 2022-03-12 NOTE — ED Provider Notes (Signed)
[facility]    CSN: [id number] Arrival date & time: 03/12/22  0845      History   Chief Complaint Chief Complaint  Patient presents with   Glucose Low    HPI Rebecca Reyes is a 22 y.o. female presents for evaluation of states/vomiting, dizziness, and fatigue.  Patient reports 5 days of intermittent nausea with no more than 2 episodes of vomiting per day that occurs only after eating.  She does endorse some dizziness that occurs after her vomiting episodes but denies any syncope.  Denies any fevers, chills, URI symptoms, body aches, diarrhea.  She does have history of constipation but does not remember the last time she had a bowel movement.  States she is able to stay hydrated and keep fluids down.  No dysuria.  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  When her symptoms first began at work she did have them check her blood sugar and states it was 74.  No history of diabetes or hypoglycemia.  She has not taken any OTC medications no other concerns at this time.  HPI  Past Medical History:  Diagnosis Date   Anxiety     Patient Active Problem List   Diagnosis Date Noted   Generalized anxiety disorder with panic attacks 01/27/2022    History reviewed. No pertinent surgical history.  OB History   No obstetric history on file.      Home Medications    Prior to Admission medications   Medication Sig Start Date End Date Taking? Authorizing Provider  cephALEXin (KEFLEX) 500 MG capsule Take 1 capsule (500 mg total) by mouth 2 (two) times daily for 7 days. 03/12/22 03/19/22 Yes Melynda Ripple, NP  hydrOXYzine (ATARAX) 10 MG tablet Take 1 tablet (10 mg total) by mouth every 8 (eight) hours as needed for anxiety (or sleep). 02/16/22  Yes France Ravens, MD  ondansetron (ZOFRAN) 4 MG tablet Take 1 tablet (4 mg total) by mouth every 8 (eight) hours as needed for nausea or vomiting. 03/12/22  Yes Melynda Ripple, NP   venlafaxine XR (EFFEXOR XR) 75 MG 24 hr capsule Take 1 capsule (75 mg total) by mouth daily with breakfast. 02/16/22 04/17/22 Yes France Ravens, MD    Family History Family History  Problem Relation Age of Onset   Bipolar disorder Maternal Aunt    Schizophrenia Maternal Aunt    Drug abuse Maternal Aunt    Bipolar disorder Maternal Uncle    Bipolar disorder Maternal Grandmother    Schizophrenia Maternal Grandmother     Social History Social History   Tobacco Use   Smoking status: Never   Smokeless tobacco: Never  Vaping Use   Vaping Use: Never used  Substance Use Topics   Alcohol use: No   Drug use: Not Currently    Types: Marijuana     Allergies   Patient has no known allergies.   Review of Systems Review of Systems  Constitutional:  Positive for fatigue.  Gastrointestinal:  Positive for abdominal pain, constipation, nausea and vomiting.  Neurological:  Positive for dizziness.     Physical Exam Triage Vital Signs ED Triage Vitals  Enc Vitals Group     BP 03/12/22 0855 116/80     Pulse Rate 03/12/22 0855 99     Resp 03/12/22 0855 18     Temp 03/12/22 0855 97.9 F (36.6 C)     Temp Source 03/12/22 0855 Oral  SpO2 03/12/22 0855 96 %     Weight 03/12/22 0857 165 lb (74.8 kg)     Height 03/12/22 0857 5' (1.524 m)     Head Circumference --      Peak Flow --      Pain Score 03/12/22 0856 5     Pain Loc --      Pain Edu? --      Excl. in [place]? --    No data found.  Updated Vital Signs BP 116/80 (BP Location: Right Arm)   Pulse 99   Temp 97.9 F (36.6 C) (Oral)   Resp 18   Ht 5' (1.524 m)   Wt 165 lb (74.8 kg)   LMP 02/17/2022 (Exact Date)   SpO2 96%   BMI 32.22 kg/m   Visual Acuity Right Eye Distance:   Left Eye Distance:   Bilateral Distance:    Right Eye Near:   Left Eye Near:    Bilateral Near:     Physical Exam Vitals and nursing note reviewed.  Constitutional:      General: She is not in acute distress.    Appearance: Normal  appearance. She is not ill-appearing, toxic-appearing or diaphoretic.  HENT:     Head: Normocephalic and atraumatic.     Right Ear: Tympanic membrane and ear canal normal.     Left Ear: Tympanic membrane and ear canal normal.     Nose: Nose normal.  Eyes:     Extraocular Movements: Extraocular movements intact.     Pupils: Pupils are equal, round, and reactive to light.  Cardiovascular:     Rate and Rhythm: Normal rate and regular rhythm.     Heart sounds: Normal heart sounds.  Pulmonary:     Effort: Pulmonary effort is normal.     Breath sounds: Normal breath sounds.  Abdominal:     Palpations: Abdomen is soft.     Tenderness: There is generalized abdominal tenderness. There is no right CVA tenderness or left CVA tenderness. Negative signs include Rovsing's sign and McBurney's sign.  Skin:    General: Skin is warm and dry.  Neurological:     General: No focal deficit present.     Mental Status: She is alert and oriented to person, place, and time.  Psychiatric:        Mood and Affect: Mood normal.        Behavior: Behavior normal.      UC Treatments / Results  Labs (all labs ordered are listed, but only abnormal results are displayed) Labs Reviewed  POCT URINALYSIS DIP (MANUAL ENTRY) - Abnormal; Notable for the following components:      Result Value   Clarity, UA hazy (*)    Spec Grav, UA >=1.030 (*)    Protein Ur, POC trace (*)    Leukocytes, UA Trace (*)    All other components within normal limits  SARS CORONAVIRUS 2 (TAT 6-24 HRS)  URINE CULTURE  POCT FASTING CBG KUC MANUAL ENTRY  POCT URINE PREGNANCY    EKG   Radiology DG Abd 1 View  Result Date: 03/12/2022 CLINICAL DATA:  Nausea and vomiting.  Constipation. EXAM: ABDOMEN - 1 VIEW COMPARISON:  None Available. FINDINGS: Moderate stool is seen throughout the course of the colon. No evidence of dilated bowel loops or other significant radiographic abnormality. IMPRESSION: Moderate stool burden. No evidence of  bowel obstruction. Electronically Signed   By: Marlaine Hind M.D.   On: 03/12/2022 09:49    Procedures Procedures (  including critical care time)  Medications Ordered in UC Medications - No data to display  Initial Impression / Assessment and Plan / UC Course  I have reviewed the triage vital signs and the nursing notes.  Pertinent labs & imaging results that were available during my care of the patient were reviewed by me and considered in my medical decision making (see chart for details).     Fasting blood glucose was 82 Reviewed exam and symptoms with patient.  No red flags on exam. UA hazy with trace leuks, will culture and start Keflex Zofran as needed nausea Brat diet and advance as tolerated.  Encourage fluids with Gatorade, Powerade, Pedialyte Discussed constipation and advised OTC laxative such as MiraLAX Follow-up with PCP in 2 days for recheck ER precautions reviewed and patient verbalized understanding Final Clinical Impressions(s) / UC Diagnoses   Final diagnoses:  Nausea and vomiting, unspecified vomiting type  Generalized abdominal pain  Constipation, unspecified constipation type  Acute cystitis without hematuria     Discharge Instructions      Keflex twice daily for 7 days.  I will culture your urine and contact you if those results are positive Zofran as needed for nausea/vomiting.  Please stay hydrated with over-the-counter Gatorade, Powerade, Pedialyte.  Brat diet (bread rice applesauce toast bananas) and advance as tolerated Take over-the-counter laxative such as MiraLAX daily. Follow-up with your PCP in 2 days for recheck Please go to the emergency room if you have any worsening symptoms     ED Prescriptions     Medication Sig Dispense Auth. Provider   cephALEXin (KEFLEX) 500 MG capsule Take 1 capsule (500 mg total) by mouth 2 (two) times daily for 7 days. 14 capsule Radford Pax, NP   ondansetron (ZOFRAN) 4 MG tablet Take 1 tablet (4 mg total)  by mouth every 8 (eight) hours as needed for nausea or vomiting. 12 tablet Radford Pax, NP      PDMP not reviewed this encounter.   Radford Pax, NP 03/12/22 [phone]

## 2022-03-13 LAB — SARS CORONAVIRUS 2 (TAT 6-24 HRS): SARS Coronavirus 2: NEGATIVE

## 2022-03-14 LAB — URINE CULTURE: Culture: NO GROWTH

## 2022-03-16 ENCOUNTER — Encounter (INDEPENDENT_AMBULATORY_CARE_PROVIDER_SITE_OTHER): Payer: Self-pay | Admitting: Primary Care

## 2022-03-16 ENCOUNTER — Ambulatory Visit (INDEPENDENT_AMBULATORY_CARE_PROVIDER_SITE_OTHER): Payer: Self-pay | Admitting: Primary Care

## 2022-03-16 ENCOUNTER — Ambulatory Visit (INDEPENDENT_AMBULATORY_CARE_PROVIDER_SITE_OTHER): Payer: No Payment, Other | Admitting: Clinical

## 2022-03-16 VITALS — BP 120/78 | HR 82 | Resp 16 | Ht 60.0 in

## 2022-03-16 DIAGNOSIS — F410 Panic disorder [episodic paroxysmal anxiety] without agoraphobia: Secondary | ICD-10-CM

## 2022-03-16 DIAGNOSIS — Z1159 Encounter for screening for other viral diseases: Secondary | ICD-10-CM

## 2022-03-16 DIAGNOSIS — Z6830 Body mass index (BMI) 30.0-30.9, adult: Secondary | ICD-10-CM

## 2022-03-16 DIAGNOSIS — F411 Generalized anxiety disorder: Secondary | ICD-10-CM | POA: Diagnosis not present

## 2022-03-16 DIAGNOSIS — Z833 Family history of diabetes mellitus: Secondary | ICD-10-CM

## 2022-03-16 DIAGNOSIS — E6609 Other obesity due to excess calories: Secondary | ICD-10-CM

## 2022-03-16 DIAGNOSIS — Z13220 Encounter for screening for lipoid disorders: Secondary | ICD-10-CM

## 2022-03-16 DIAGNOSIS — Z09 Encounter for follow-up examination after completed treatment for conditions other than malignant neoplasm: Secondary | ICD-10-CM

## 2022-03-16 DIAGNOSIS — R112 Nausea with vomiting, unspecified: Secondary | ICD-10-CM

## 2022-03-16 DIAGNOSIS — Z114 Encounter for screening for human immunodeficiency virus [HIV]: Secondary | ICD-10-CM

## 2022-03-16 NOTE — Progress Notes (Signed)
[facility]   Subjective:   [name] is a 22 y.o. female presents for hospital follow up and establish care. Presented to Urgent care on  03/12/22, female presents for evaluation for vomiting, dizziness, and fatigue.  She does endorse some increase in urination and hunger.Patient has No headache, No chest pain, No abdominal pain - No Nausea, No new weakness tingling or numbness, No Cough - shortness of breath  Past Medical History:  Diagnosis Date   Anxiety      No Known Allergies    Current Outpatient Medications on File Prior to Visit  Medication Sig Dispense Refill   cephALEXin (KEFLEX) 500 MG capsule Take 1 capsule (500 mg total) by mouth 2 (two) times daily for 7 days. 14 capsule 0   hydrOXYzine (ATARAX) 10 MG tablet Take 1 tablet (10 mg total) by mouth every 8 (eight) hours as needed for anxiety (or sleep). 60 tablet 2   ondansetron (ZOFRAN) 4 MG tablet Take 1 tablet (4 mg total) by mouth every 8 (eight) hours as needed for nausea or vomiting. 12 tablet 0   venlafaxine XR (EFFEXOR XR) 75 MG 24 hr capsule Take 1 capsule (75 mg total) by mouth daily with breakfast. 30 capsule 1   No current facility-administered medications on file prior to visit.     Review of System: Comprehensive ROS Pertinent positive and negative noted in HPI    Objective:  Blood Pressure 120/78   Pulse 82   Respiration 16   Height 5' (1.524 m)   Last Menstrual Period 02/17/2022 (Exact Date)   Oxygen Saturation 96%   Body Mass Index 32.22 kg/m   Physical Exam: General Appearance: Well nourished obese female  , in no apparent distress. Eyes: PERRLA, EOMs, conjunctiva no swelling or erythema Sinuses: No Frontal/maxillary tenderness ENT/Mouth: Ext aud canals clear, TMs without erythema, bulging. Hearing normal.  Neck: Supple, thyroid normal.  Respiratory: Respiratory effort normal, BS equal bilaterally without rales, rhonchi, wheezing or stridor.  Cardio: RRR with no MRGs.  Brisk peripheral pulses without edema.  Abdomen: Soft, + BS.  Non tender, no guarding, rebound, hernias, masses. Lymphatics: Non tender without lymphadenopathy.  Musculoskeletal: Full ROM, 5/5 strength, normal gait.  Skin: Warm, dry without rashes, lesions, ecchymosis.  Neuro: Cranial nerves intact. Normal muscle tone, no cerebellar symptoms. Sensation intact.  Psych: Awake and oriented X 3, normal affect, Insight and Judgment appropriate.    Assessment:  Misk was seen today for hospitalization follow-up.  Diagnoses and all orders for this visit:  Encounter for HCV screening test for low risk patient -     HCV Ab w Reflex to Quant PCR  Encounter for screening for HIV -     HIV Antibody (routine testing w rflx)  Family history of diabetes mellitus -     Hemoglobin A1c  Nausea and vomiting, unspecified vomiting type -     CBC with Differential/Platelet -     [facility] discharge follow-up -     CBC with Differential/Platelet -     CMP14+EGFR  Lipid screening -     Lipid panel  Class 1 obesity due to excess calories without serious comorbidity with body mass index (BMI) of 30.0 to 30.9 in adult  Obesity is 30-39 indicating an excess in caloric intake or underlining conditions. This may lead to other co-morbidities. Educated on lifestyle modifications of diet and exercise which may reduce obesity.     This note has been created with Dragon speech  Land. Any transcriptional errors are unintentional.   Kerin Perna, NP 03/16/2022, 9:23 AM

## 2022-03-16 NOTE — Progress Notes (Signed)
THERAPIST PROGRESS NOTE Virtual Visit via Video Note  I connected with Rebecca Reyes on 03/16/22 at 11:00 AM EST by a video enabled telemedicine application and verified that I am speaking with the correct person using two identifiers.  Location: Patient: home Provider: office   I discussed the limitations of evaluation and management by telemedicine and the availability of in person appointments. The patient expressed understanding and agreed to proceed.   Follow Up Instructions:  I discussed the assessment and treatment plan with the patient. The patient was provided an opportunity to ask questions and all were answered. The patient agreed with the plan and demonstrated an understanding of the instructions.   The patient was advised to call back or seek an in-person evaluation if the symptoms worsen or if the condition fails to improve as anticipated.   Session Time: 45 minutes  Participation Level: Active  Behavioral Response: CasualAlertEuthymic  Type of Therapy: Individual Therapy  Treatment Goals addressed: client will attend at least 80% of scheduled individual psychotherapy sessions  ProgressTowards Goals: Progressing  Interventions: CBT  Summary:  Rebecca Reyes is a 22 y.o. female who presents for the scheduled appointment oriented times five, appropriately dressed, and friendly. Client denied hallucinations and delusions. Client reported she is doing pretty well today. Client reported her psychiatric medications she is using have been very helpful. Client reported she has been using her daily medication but has not needed to use her PRN medications. Client reported the psychiatrist suggested a med increase but she does not think she will take the increased dosage. Client reported she is not sure where to start with therapy exactly. Client reported over time since childhood she has experienced a lot of stress in the home and negativity from family. Client reported  she just recently started being in communication with her mother after having her blocked for a month. Client reported her mothers presence in her life has been inconsistent and she is still active user of illicit drugs. Client reported her positive support includes her sister, cousin, and friends. Client reported she has been out of work for the past week due to being physically ill. Client reported she  has gone to the doctor to have blood work done due to inability to  keep food down, nausea, and dizziness. Client reported her job is accommodating. Client reported she also may have located her biological father but is still working on the confirmation.    Evidence of progress towards goal:  client reported medication compliance 7 days per week.   Suicidal/Homicidal: Nowithout intent/plan  Therapist Response:  Therapist began the appointment asking the client how she has been doing. Therapist used CBT to engage using active listening and positive emotional support. Therapist used CBT to engage asking the client open ended questions about childhood history. Therapist used CBT to engage and ask the client about symptoms of anxiety and depression. Therapist used CBT to reinforce the clients use of positive coping skills. Therapist used CBT ask the client to identify her progress with frequency of use with coping skills with continued practice in her daily activity.    Therapist assigned the client homework to practice self care.    Plan: Return again in 3 weeks.  Diagnosis: generalized anxiety disorder with panic attacks  Collaboration of Care: Patient refused AEB none requested by the client.  Patient/Guardian was advised Release of Information must be obtained prior to any record release in order to collaborate their care with an outside provider.  Patient/Guardian was advised if they have not already done so to contact the registration department to sign all necessary forms in order for [place] to  release information regarding their care.   Consent: Patient/Guardian gives verbal consent for treatment and assignment of benefits for services provided during this visit. Patient/Guardian expressed understanding and agreed to proceed.   [place], LCSW 03/16/2022

## 2022-03-17 LAB — CMP14+EGFR
ALT: 32 IU/L (ref 0–32)
AST: 17 IU/L (ref 0–40)
Albumin/Globulin Ratio: 1.5 (ref 1.2–2.2)
Albumin: 4.6 g/dL (ref 4.0–5.0)
Alkaline Phosphatase: 78 IU/L (ref 44–121)
BUN/Creatinine Ratio: 10 (ref 9–23)
BUN: 8 mg/dL (ref 6–20)
Bilirubin Total: 0.4 mg/dL (ref 0.0–1.2)
CO2: 24 mmol/L (ref 20–29)
Calcium: 9.6 mg/dL (ref 8.7–10.2)
Chloride: 100 mmol/L (ref 96–106)
Creatinine, Ser: 0.79 mg/dL (ref 0.57–1.00)
Globulin, Total: 3 g/dL (ref 1.5–4.5)
Glucose: 82 mg/dL (ref 70–99)
Potassium: 4.6 mmol/L (ref 3.5–5.2)
Sodium: 138 mmol/L (ref 134–144)
Total Protein: 7.6 g/dL (ref 6.0–8.5)
eGFR: 109 mL/min/{1.73_m2} (ref >59)

## 2022-03-17 LAB — CBC WITH DIFFERENTIAL/PLATELET
Basophils Absolute: 0 10*3/uL (ref 0.0–0.2)
Basos: 1 %
EOS (ABSOLUTE): 0 10*3/uL (ref 0.0–0.4)
Eos: 1 %
Hematocrit: 41.5 % (ref 34.0–46.6)
Hemoglobin: 13.3 g/dL (ref 11.1–15.9)
Immature Grans (Abs): 0 10*3/uL (ref 0.0–0.1)
Immature Granulocytes: 0 %
Lymphocytes Absolute: 1.5 10*3/uL (ref 0.7–3.1)
Lymphs: 44 %
MCH: 26.9 pg (ref 26.6–33.0)
MCHC: 32 g/dL (ref 31.5–35.7)
MCV: 84 fL (ref 79–97)
Monocytes Absolute: 0.3 10*3/uL (ref 0.1–0.9)
Monocytes: 9 %
Neutrophils Absolute: 1.6 10*3/uL (ref 1.4–7.0)
Neutrophils: 45 %
Platelets: 358 10*3/uL (ref 150–450)
RBC: 4.94 x10E6/uL (ref 3.77–5.28)
RDW: 12.8 % (ref 11.7–15.4)
WBC: 3.5 10*3/uL (ref 3.4–10.8)

## 2022-03-17 LAB — HCV AB W REFLEX TO QUANT PCR: HCV Ab: NONREACTIVE

## 2022-03-17 LAB — LIPID PANEL
Chol/HDL Ratio: 3.3 ratio (ref 0.0–4.4)
Cholesterol, Total: 173 mg/dL (ref 100–199)
HDL: 53 mg/dL (ref >39)
LDL Chol Calc (NIH): 110 mg/dL — ABNORMAL HIGH (ref 0–99)
Triglycerides: 50 mg/dL (ref 0–149)
VLDL Cholesterol Cal: 10 mg/dL (ref 5–40)

## 2022-03-17 LAB — HEMOGLOBIN A1C
Est. average glucose Bld gHb Est-mCnc: 114 mg/dL
Hgb A1c MFr Bld: 5.6 % (ref 4.8–5.6)

## 2022-03-17 LAB — HIV ANTIBODY (ROUTINE TESTING W REFLEX): HIV Screen 4th Generation wRfx: NONREACTIVE

## 2022-03-17 LAB — HCV INTERPRETATION

## 2022-03-21 ENCOUNTER — Encounter (INDEPENDENT_AMBULATORY_CARE_PROVIDER_SITE_OTHER): Payer: Self-pay | Admitting: Primary Care

## 2022-03-22 ENCOUNTER — Ambulatory Visit (INDEPENDENT_AMBULATORY_CARE_PROVIDER_SITE_OTHER): Payer: Self-pay | Admitting: Primary Care

## 2022-03-27 ENCOUNTER — Telehealth: Payer: Self-pay | Admitting: Primary Care

## 2022-03-27 NOTE — Telephone Encounter (Signed)
Copied from [place] [id number]. Topic: General - Other >> Mar 27, 2022  3:05 PM Sabas Sous wrote: Reason for CRM: Pt called following request from PCP to contact the office. Awaiting feedback from PCP, does pt need an appt to have her forms filled or can she drop them off?  Says she needs this before the 15th

## 2022-03-28 NOTE — Telephone Encounter (Signed)
Returned pt call to get more information on why form is needed pt didn't lvm

## 2022-03-28 NOTE — Telephone Encounter (Signed)
Returned pt call pt didn't answer. Left a detailed vm making pt aware that per michelle she will write a note for her for the one day she is requesting. Per michelle the form is only covering her for one day. Asked pt to give [place] a call back

## 2022-03-28 NOTE — Telephone Encounter (Signed)
Patient returned a call to [facility] about the form that is needed. Patient said her next break at work is at 12:30 and then 3:30.

## 2022-03-28 NOTE — Telephone Encounter (Signed)
Will forward to nurse 

## 2022-03-30 ENCOUNTER — Encounter (HOSPITAL_COMMUNITY): Payer: No Payment, Other | Admitting: Student

## 2022-03-30 ENCOUNTER — Ambulatory Visit (INDEPENDENT_AMBULATORY_CARE_PROVIDER_SITE_OTHER): Payer: No Payment, Other | Admitting: Clinical

## 2022-03-30 DIAGNOSIS — F410 Panic disorder [episodic paroxysmal anxiety] without agoraphobia: Secondary | ICD-10-CM | POA: Diagnosis not present

## 2022-03-30 DIAGNOSIS — F411 Generalized anxiety disorder: Secondary | ICD-10-CM

## 2022-03-30 NOTE — Progress Notes (Incomplete)
[facility] MD Outpatient Progress Note  03/30/2022 10:55 AM Rebecca Reyes  MRN:  [id number]  Assessment:  [name] presents for follow-up evaluation in-person. Today, 03/30/22, patient reports ***  Identifying Information: Rebecca Reyes is a 22 y.o. y.o. female with a history of GAD with panic attacks who is an established patient with [facility] for medication management.     Plan: # Generalized anxiety disorder with panic attacks Past medication trials: Lexapro (taken for 1-2 months; worsening migraines); Buspar (taken for 1 month; worsening migraines) Status of problem: new problem to this provider Interventions: -- Continue Effexor XR 37.5 mg daily for depression -- Risks, benefits, and side effects including but not limited to HA, increased BP, sexual side effects, withdrawal symptoms, and FDA black box warning for increased suicidality in young adults were reviewed with informed consent provided -- Continue Atarax to 10 mg TID PRN anxiety/sleep             -- Risks, benefits, and side effects including but not limited to sedation, dizziness, and dry mouth were reviewed with informed consent provided -- Continue psychotherapy    # Cannabis use disorder in early remission Past medication trials: none Status of problem: early remission Interventions: -- Continue to monitor and promote ongoing cessation     Patient was given contact information for behavioral health clinic and was instructed to call 911 for emergencies.   Subjective:  Chief Complaint: No chief complaint on file.   Interval History: ***  Visit Diagnosis: No diagnosis found.  Past Psychiatric History: ***  Past Medical History:  Past Medical History:  Diagnosis Date  . Anxiety    No past surgical history on file.  Family Psychiatric History: ***  Family History:  Family History  Problem Relation Age of Onset  . Bipolar disorder Maternal Aunt   . Schizophrenia Maternal  Aunt   . Drug abuse Maternal Aunt   . Bipolar disorder Maternal Uncle   . Bipolar disorder Maternal Grandmother   . Schizophrenia Maternal Grandmother     Social History:  Social History   Socioeconomic History  . Marital status: Single    Spouse name: Not on file  . Number of children: Not on file  . Years of education: Not on file  . Highest education level: Not on file  Occupational History  . Not on file  Tobacco Use  . Smoking status: Never  . Smokeless tobacco: Never  Vaping Use  . Vaping Use: Never used  Substance and Sexual Activity  . Alcohol use: No  . Drug use: Not Currently    Types: Marijuana  . Sexual activity: Yes    Birth control/protection: None  Other Topics Concern  . Not on file  Social History Narrative  . Not on file   Social Determinants of Health   Financial Resource Strain: Not on file  Food Insecurity: Not on file  Transportation Needs: Not on file  Physical Activity: Not on file  Stress: Not on file  Social Connections: Not on file    Allergies: No Known Allergies  Current Medications: Current Outpatient Medications  Medication Sig Dispense Refill  . hydrOXYzine (ATARAX) 10 MG tablet Take 1 tablet (10 mg total) by mouth every 8 (eight) hours as needed for anxiety (or sleep). 60 tablet 2  . ondansetron (ZOFRAN) 4 MG tablet Take 1 tablet (4 mg total) by mouth every 8 (eight) hours as needed for nausea or vomiting. 12 tablet 0  . venlafaxine  XR (EFFEXOR XR) 75 MG 24 hr capsule Take 1 capsule (75 mg total) by mouth daily with breakfast. 30 capsule 1   No current facility-administered medications for this visit.    ROS: Review of Systems  Objective:  Psychiatric Specialty Exam: Last menstrual period 02/17/2022.There is no height or weight on file to calculate BMI.  General Appearance: {Appearance:22683}  Eye Contact:  {BHH EYE CONTACT:22684}  Speech:  {Speech:22685}  Volume:  {Volume (PAA):22686}  Mood:  {BHH MOOD:22306}   Affect:  {Affect (PAA):22687}  Thought Process:  {Thought Process (PAA):22688}  Orientation:  {BHH ORIENTATION (PAA):22689}  Thought Content: {Thought Content:22690}   Suicidal Thoughts:  {ST/HT (PAA):22692}  Homicidal Thoughts:  {ST/HT (PAA):22692}  Memory:  {BHH AG:6666793  Judgment:  {Judgement (PAA):22694}  Insight:  {Insight (PAA):22695}  Psychomotor Activity:  {Psychomotor (PAA):22696}  Concentration:  {Concentration:21399}              Assets:  {Assets (PAA):22698}  ADL's:  {BHH TW:9249394  Cognition: {chl bhh cognition:304700322}  Sleep:  {BHH GOOD/FAIR/POOR:22877}   PE: General: well-appearing; no acute distress *** Pulm: no increased work of breathing on room air *** Strength & Muscle Tone: {desc; muscle tone:32375} Neuro: no focal neurological deficits observed *** Gait & Station: {PE GAIT ED A999333  Metabolic Disorder Labs: Lab Results  Component Value Date   HGBA1C 5.6 03/16/2022   No results found for: "PROLACTIN" Lab Results  Component Value Date   CHOL 173 03/16/2022   TRIG 50 03/16/2022   HDL 53 03/16/2022   CHOLHDL 3.3 03/16/2022   LDLCALC 110 (H) 03/16/2022   Lab Results  Component Value Date   TSH 1.470 06/10/2019    Therapeutic Level Labs: No results found for: "LITHIUM" No results found for: "VALPROATE" No results found for: "CBMZ"  Screenings: GAD-7    Flowsheet Row Counselor from 02/21/2022 in [facility] Office Visit from 10/12/2021 in [facility] Office Visit from 08/31/2021 in [facility] Office Visit from 10/19/2020 in [facility] from 02/18/2020 in [facility]  Total GAD-7 Score 21 21 21 12 14      $ PHQ2-9    [place] Office Visit from 10/12/2021 in [place] Office Visit from 08/31/2021 in [place] Office  Visit from 10/19/2020 in [facility] from 02/18/2020 in [place] Office Visit from 01/28/2020 in [facility] for [place] [facility]  PHQ-2 Total Score 4 4 2 $ 0 3  PHQ-9 Total Score 18 13 8 14 18      $ Flowsheet Row ED from 03/12/2022 in [facility] Counselor from 02/21/2022 in [facility] ED from 09/29/2021 in [facility] Emergency Department at [place] No Risk No Risk No Risk       Collaboration of Care: Collaboration of Care: [facility] OP Collaboration of Care:[id number]}  Patient/Guardian was advised Release of Information must be obtained prior to any record release in order to collaborate their care with an outside provider. Patient/Guardian was advised if they have not already done so to contact the registration department to sign all necessary forms in order for [place] to release information regarding their care.   Consent: Patient/Guardian gives verbal consent for treatment and assignment of benefits for services provided during this visit. Patient/Guardian expressed understanding and agreed to proceed.  A total of *** minutes was spent involved in face to face clinical care, chart review, and documentation.   France Ravens, MD 03/30/2022, 10:55 AM

## 2022-03-31 NOTE — Progress Notes (Signed)
THERAPIST PROGRESS NOTE Virtual Visit via Video Note  I connected with Rebecca Reyes on 03/30/2022 at 11:00 AM EST by a video enabled telemedicine application and verified that I am speaking with the correct person using two identifiers.  Location: Patient: home Provider: office   I discussed the limitations of evaluation and management by telemedicine and the availability of in person appointments. The patient expressed understanding and agreed to proceed.   Follow Up Instructions: I discussed the assessment and treatment plan with the patient. The patient was provided an opportunity to ask questions and all were answered. The patient agreed with the plan and demonstrated an understanding of the instructions.   The patient was advised to call back or seek an in-person evaluation if the symptoms worsen or if the condition fails to improve as anticipated.   Session Time: 40 minutes  Participation Level: Active  Behavioral Response: CasualAlertEuthymic  Type of Therapy: Individual Therapy  Treatment Goals addressed: client will practice problem solving skills 3 times per week for the next 4 weeks  ProgressTowards Goals: Progressing  Interventions: CBT and Supportive  Summary:  Rebecca Reyes is a 22 y.o. female who presents for the scheduled appointment oriented times five, appropriately dressed, and friendly. Client denied hallucinations and delusions. Client reported she is doing fairly well today. Client reported she is off of work today. Client reported she has received her blood work results back from her PCP and was told her cholesterol is high. Client reported she was suggested to make diet changes. Client reported she is not convinced that is the reason why she has been having ill feeling episodes. Client reported she has not been having restful sleep. Client reported she has vivid dreams sometimes about her family. Client reported her mind does tend to race about  different things. Client reported she has been going back and forth about wanting to know who her biological dad is. Client reported she does not know how she would want that relationship to be. Client reported she talks to her mother infrequently. Evidence of progress towards goal:  client reported 1 issue of her mind racing. Client reported she will practice behavioral activation to help manage that feeling.   Suicidal/Homicidal: Nowithout intent/plan  Therapist Response:  Therapist began the appointment asking the client how she has been doing since last seen. Therapist used CBT to engage using active listening and positive emotional support. Therapist used CBT to ask the client about her health update from seeing her doctor and her thoughts about results. Therapist used CBT to ask the client about anxiety symptoms and the source of the emotion. Therapist used CBT ask the client to identify her progress with frequency of use with coping skills with continued practice in her daily activity.    Therapist assigned the client homework to journal her thoughts.     Plan: Return again in 3 weeks.  Diagnosis: generalized anxiety disorder, with panic attacks  Collaboration of Care: Patient refused AEB none requested by the client.  Patient/Guardian was advised Release of Information must be obtained prior to any record release in order to collaborate their care with an outside provider. Patient/Guardian was advised if they have not already done so to contact the registration department to sign all necessary forms in order for [place] to release information regarding their care.   Consent: Patient/Guardian gives verbal consent for treatment and assignment of benefits for services provided during this visit. Patient/Guardian expressed understanding and agreed to proceed.  [place], LCSW 03/30/2022

## 2022-04-03 ENCOUNTER — Other Ambulatory Visit: Payer: Self-pay

## 2022-04-04 ENCOUNTER — Ambulatory Visit (HOSPITAL_COMMUNITY)
Admission: EM | Admit: 2022-04-04 | Discharge: 2022-04-04 | Disposition: A | Discharge status: Home / Self Care | Payer: Self-pay | Attending: Physician Assistant | Admitting: Physician Assistant

## 2022-04-04 ENCOUNTER — Other Ambulatory Visit: Payer: Self-pay

## 2022-04-04 ENCOUNTER — Encounter (HOSPITAL_COMMUNITY): Payer: Self-pay

## 2022-04-04 DIAGNOSIS — R110 Nausea: Secondary | ICD-10-CM

## 2022-04-04 DIAGNOSIS — R42 Dizziness and giddiness: Secondary | ICD-10-CM

## 2022-04-04 LAB — POCT URINALYSIS DIPSTICK, ED / UC
Bilirubin Urine: NEGATIVE
Glucose, UA: NEGATIVE mg/dL
Hgb urine dipstick: NEGATIVE
Ketones, ur: NEGATIVE mg/dL
Leukocytes,Ua: NEGATIVE
Nitrite: NEGATIVE
Protein, ur: NEGATIVE mg/dL
Specific Gravity, Urine: 1.02 (ref 1.005–1.030)
Urobilinogen, UA: 0.2 mg/dL (ref 0.0–1.0)
pH: 8.5 — ABNORMAL HIGH (ref 5.0–8.0)

## 2022-04-04 LAB — POC URINE PREG, ED: Preg Test, Ur: NEGATIVE

## 2022-04-04 MED ORDER — MECLIZINE HCL 25 MG PO TABS
25.0000 mg | ORAL_TABLET | Freq: Three times a day (TID) | ORAL | 0 refills | Status: DC | PRN
  Filled 2022-04-04: qty 30, 10d supply, fill #0

## 2022-04-04 NOTE — ED Triage Notes (Signed)
Patient c/o dizziness, headache, and nausea x 3 days.  Patient denies taking any medications for her symptoms.

## 2022-04-04 NOTE — ED Provider Notes (Signed)
[facility]    CSN: [id number] Arrival date & time: 04/04/22  1621      History   Chief Complaint Chief Complaint  Patient presents with   Dizziness   Headache   Nausea    HPI Rebecca Reyes is a 22 y.o. female.   Patient presents today with a several day history of intermittent dizziness.  She describes this as a room spinning sensation that is worse when she closes her eyes or moves her head.  She denies any syncopal episode or near syncope.  She reports associated mild headache as well as nausea but denies any vomiting, cough, congestion, chest pain, shortness of breath.  She denies any dysarthria, focal weakness, visual disturbance.  She has not tried any over-the-counter medication for symptom management.  She denies any recent head injury or medication changes.  She is eating and drinking normally.  Denies any known sick contacts.  Denies episodes of similar symptoms in the past.  She does not believe that she could be pregnant but is open to testing.    Past Medical History:  Diagnosis Date   Anxiety     Patient Active Problem List   Diagnosis Date Noted   Generalized anxiety disorder with panic attacks 01/27/2022    History reviewed. No pertinent surgical history.  OB History   No obstetric history on file.      Home Medications    Prior to Admission medications   Medication Sig Start Date End Date Taking? Authorizing Provider  meclizine (ANTIVERT) 25 MG tablet Take 1 tablet (25 mg total) by mouth 3 (three) times daily as needed for dizziness. 04/04/22  Yes Fatmata Legere K, PA-C  hydrOXYzine (ATARAX) 10 MG tablet Take 1 tablet (10 mg total) by mouth every 8 (eight) hours as needed for anxiety (or sleep). 02/16/22   France Ravens, MD  ondansetron (ZOFRAN) 4 MG tablet Take 1 tablet (4 mg total) by mouth every 8 (eight) hours as needed for nausea or vomiting. 03/12/22   Melynda Ripple, NP  venlafaxine XR (EFFEXOR XR) 75 MG 24 hr capsule Take 1 capsule (75 mg  total) by mouth daily with breakfast. 02/16/22 05/04/22  France Ravens, MD    Family History Family History  Problem Relation Age of Onset   Bipolar disorder Maternal Aunt    Schizophrenia Maternal Aunt    Drug abuse Maternal Aunt    Bipolar disorder Maternal Uncle    Bipolar disorder Maternal Grandmother    Schizophrenia Maternal Grandmother     Social History Social History   Tobacco Use   Smoking status: Never   Smokeless tobacco: Never  Vaping Use   Vaping Use: Never used  Substance Use Topics   Alcohol use: No   Drug use: Not Currently    Types: Marijuana     Allergies   Patient has no known allergies.   Review of Systems Review of Systems  Constitutional:  Negative for activity change, appetite change, fatigue and fever.  HENT:  Negative for congestion and sore throat.   Respiratory:  Negative for cough and shortness of breath.   Cardiovascular:  Negative for chest pain.  Gastrointestinal:  Positive for nausea. Negative for abdominal pain, diarrhea and vomiting.  Musculoskeletal:  Negative for arthralgias and myalgias.  Neurological:  Positive for dizziness and headaches. Negative for seizures, syncope, facial asymmetry, speech difficulty, weakness, light-headedness and numbness.     Physical Exam Triage Vital Signs ED Triage Vitals  Enc Vitals Group  BP 04/04/22 1705 121/88     Pulse Rate 04/04/22 1705 89     Resp 04/04/22 1705 14     Temp 04/04/22 1705 98.8 F (37.1 C)     Temp Source 04/04/22 1705 Oral     SpO2 04/04/22 1705 98 %     Weight --      Height --      Head Circumference --      Peak Flow --      Pain Score 04/04/22 1706 6     Pain Loc --      Pain Edu? --      Excl. in [place]? --    No data found.  Updated Vital Signs BP 121/88 (BP Location: Left Arm)   Pulse 89   Temp 98.8 F (37.1 C) (Oral)   Resp 14   LMP 03/20/2022 (Approximate)   SpO2 98%   Visual Acuity Right Eye Distance:   Left Eye Distance:   Bilateral Distance:     Right Eye Near:   Left Eye Near:    Bilateral Near:     Physical Exam Vitals reviewed.  Constitutional:      General: She is awake. She is not in acute distress.    Appearance: Normal appearance. She is well-developed. She is not ill-appearing.     Comments: Very pleasant female appears stated age in no acute distress sitting comfortably in exam room  HENT:     Head: Normocephalic and atraumatic. No raccoon eyes, Battle's sign or contusion.     Right Ear: Tympanic membrane, ear canal and external ear normal. No hemotympanum.     Left Ear: Tympanic membrane, ear canal and external ear normal. No hemotympanum.     Mouth/Throat:     Tongue: Tongue does not deviate from midline.     Pharynx: Uvula midline. No oropharyngeal exudate or posterior oropharyngeal erythema.  Eyes:     Extraocular Movements: Extraocular movements intact.     Conjunctiva/sclera: Conjunctivae normal.     Pupils: Pupils are equal, round, and reactive to light.  Cardiovascular:     Rate and Rhythm: Normal rate and regular rhythm.     Heart sounds: Normal heart sounds, S1 normal and S2 normal. No murmur heard. Pulmonary:     Effort: Pulmonary effort is normal.     Breath sounds: Normal breath sounds. No wheezing, rhonchi or rales.     Comments: Clear to auscultation bilaterally Abdominal:     General: Bowel sounds are normal.     Palpations: Abdomen is soft.     Tenderness: There is no abdominal tenderness. There is no right CVA tenderness, left CVA tenderness, guarding or rebound.  Musculoskeletal:     Cervical back: No spinous process tenderness or muscular tenderness.     Comments: Strength 5/5 bilateral upper and lower extremities  Neurological:     General: No focal deficit present.     Mental Status: She is alert and oriented to person, place, and time.     Cranial Nerves: Cranial nerves 2-12 are intact.     Motor: Motor function is intact.     Coordination: Coordination is intact.     Gait: Gait is  intact.     Comments: Cranial nerves II through XII grossly intact.  Psychiatric:        Behavior: Behavior is cooperative.      UC Treatments / Results  Labs (all labs ordered are listed, but only abnormal results are displayed) Labs Reviewed  POCT URINALYSIS DIPSTICK, ED / UC - Abnormal; Notable for the following components:      Result Value   pH 8.5 (*)    All other components within normal limits  POC URINE PREG, ED    EKG   Radiology No results found.  Procedures Procedures (including critical care time)  Medications Ordered in UC Medications - No data to display  Initial Impression / Assessment and Plan / UC Course  I have reviewed the triage vital signs and the nursing notes.  Pertinent labs & imaging results that were available during my care of the patient were reviewed by me and considered in my medical decision making (see chart for details).     Patient is well-appearing, afebrile, nontoxic, nontachycardic.  Vital signs and physical exam are reassuring today with no indication for emergent evaluation or imaging.  Concern for vertigo given clinical presentation.  She was started on meclizine up to 3 times a day.  We discussed that she may benefit from Epley maneuver and was provided handouts on how to perform this at home.  She is to eat small frequent meals and drink plenty of fluid.  UA was obtained that showed no evidence of dehydration.  Urine pregnancy was negative.  Discussed that if her symptoms are improving quickly she follow-up with ENT and was given contact information for local provider with instruction to call to schedule an appointment.  If she has any worsening symptoms including severe/persistent vertigo, nausea/vomiting interfering with oral intake, visual disturbance, weakness, dysarthria, severe headache she needs to go to the emergency room to which she expressed understanding.  Strict return precautions given.  Work excuse note provided.  Final  Clinical Impressions(s) / UC Diagnoses   Final diagnoses:  Vertigo  Nausea without vomiting     Discharge Instructions      I believe they have vertigo.  Please take meclizine up to 3 times a day.  Perform Epley maneuver as discussed and handout.  Make sure that you eat small frequent meals and drink plenty of fluid.  If your symptoms or not improving quickly please follow-up with ENT; call to schedule an appointment.  If you have any worsening symptoms including severe headache, constant vertigo, nausea/vomiting interfering with oral intake, changes to vision, weakness, difficulty speaking you need to go to the emergency room immediately.     ED Prescriptions     Medication Sig Dispense Auth. Provider   meclizine (ANTIVERT) 25 MG tablet Take 1 tablet (25 mg total) by mouth 3 (three) times daily as needed for dizziness. 30 tablet [place] Lust, Derry Skill, PA-C      PDMP not reviewed this encounter.   Terrilee Croak, PA-C 04/04/22 1744

## 2022-04-04 NOTE — Discharge Instructions (Signed)
I believe they have vertigo.  Please take meclizine up to 3 times a day.  Perform Epley maneuver as discussed and handout.  Make sure that you eat small frequent meals and drink plenty of fluid.  If your symptoms or not improving quickly please follow-up with ENT; call to schedule an appointment.  If you have any worsening symptoms including severe headache, constant vertigo, nausea/vomiting interfering with oral intake, changes to vision, weakness, difficulty speaking you need to go to the emergency room immediately.

## 2022-04-05 ENCOUNTER — Other Ambulatory Visit: Payer: Self-pay

## 2022-04-12 ENCOUNTER — Ambulatory Visit
Admission: EM | Admit: 2022-04-12 | Discharge: 2022-04-12 | Disposition: A | Discharge status: Home / Self Care | Payer: Self-pay | Attending: Nurse Practitioner | Admitting: Nurse Practitioner

## 2022-04-12 DIAGNOSIS — G43811 Other migraine, intractable, with status migrainosus: Secondary | ICD-10-CM

## 2022-04-12 MED ORDER — ONDANSETRON 4 MG PO TBDP
4.0000 mg | ORAL_TABLET | Freq: Once | ORAL | Status: AC
  Administered 2022-04-12: 4 mg via ORAL

## 2022-04-12 MED ORDER — KETOROLAC TROMETHAMINE 30 MG/ML IJ SOLN
30.0000 mg | Freq: Once | INTRAMUSCULAR | Status: AC
  Administered 2022-04-12: 30 mg via INTRAMUSCULAR

## 2022-04-12 MED ORDER — KETOROLAC TROMETHAMINE 30 MG/ML IJ SOLN: 30.0000 mg | Freq: Once | INTRAMUSCULAR | Status: DC

## 2022-04-12 NOTE — ED Provider Notes (Signed)
[facility]    CSN: [id number] Arrival date & time: 04/12/22  1704      History   Chief Complaint Chief Complaint  Patient presents with   Migraine   Nausea   Fatigue         HPI Rebecca Reyes is a 22 y.o. female presents for evaluation of migraine.  Patient reports a history of migraines.  She reports the migraine began around lunchtime today and has been persistent since that time.  She states it is on the back part of her head and she rates it as a 7 out of 10 and denies that this is the worst headache of her life.  It is associated with photosensitivity and nausea.  She denies any vomiting, dizziness, unilateral weakness, syncope.  She also reports she will get a hot flash which feels like pins-and-needles all over her body but resolves very quickly.  She took over-the-counter ibuprofen 6 hours ago with minimal improvement in her symptoms.  She has not prescribed any abortive medications by her PCP.  No other concerns at this time.   Migraine Associated symptoms include headaches.    Past Medical History:  Diagnosis Date   Anxiety     Patient Active Problem List   Diagnosis Date Noted   Generalized anxiety disorder with panic attacks 01/27/2022    History reviewed. No pertinent surgical history.  OB History   No obstetric history on file.      Home Medications    Prior to Admission medications   Medication Sig Start Date End Date Taking? Authorizing Provider  hydrOXYzine (ATARAX) 10 MG tablet Take 1 tablet (10 mg total) by mouth every 8 (eight) hours as needed for anxiety (or sleep). 02/16/22   France Ravens, MD  meclizine (ANTIVERT) 25 MG tablet Take 1 tablet (25 mg total) by mouth 3 (three) times daily as needed for dizziness. 04/04/22   Raspet, Derry Skill, PA-C  ondansetron (ZOFRAN) 4 MG tablet Take 1 tablet (4 mg total) by mouth every 8 (eight) hours as needed for nausea or vomiting. 03/12/22   Melynda Ripple, NP  venlafaxine XR (EFFEXOR XR) 75 MG 24 hr  capsule Take 1 capsule (75 mg total) by mouth daily with breakfast. 02/16/22 05/04/22  France Ravens, MD    Family History Family History  Problem Relation Age of Onset   Bipolar disorder Maternal Aunt    Schizophrenia Maternal Aunt    Drug abuse Maternal Aunt    Bipolar disorder Maternal Uncle    Bipolar disorder Maternal Grandmother    Schizophrenia Maternal Grandmother     Social History Social History   Tobacco Use   Smoking status: Never   Smokeless tobacco: Never  Vaping Use   Vaping Use: Never used  Substance Use Topics   Alcohol use: No   Drug use: Not Currently    Types: Marijuana     Allergies   Patient has no known allergies.   Review of Systems Review of Systems  Eyes:  Positive for photophobia.  Gastrointestinal:  Positive for nausea.  Neurological:  Positive for headaches.     Physical Exam Triage Vital Signs ED Triage Vitals  Enc Vitals Group     BP 04/12/22 1715 132/86     Pulse Rate 04/12/22 1715 81     Resp 04/12/22 1715 18     Temp 04/12/22 1715 98 F (36.7 C)     Temp Source 04/12/22 1715 Oral     SpO2 04/12/22  1715 98 %     Weight --      Height --      Head Circumference --      Peak Flow --      Pain Score 04/12/22 1717 8     Pain Loc --      Pain Edu? --      Excl. in [place]? --    No data found.  Updated Vital Signs BP 132/86 (BP Location: Right Arm)   Pulse 81   Temp 98 F (36.7 C) (Oral)   Resp 18   LMP 03/20/2022 (Approximate)   SpO2 98%   Visual Acuity Right Eye Distance:   Left Eye Distance:   Bilateral Distance:    Right Eye Near:   Left Eye Near:    Bilateral Near:     Physical Exam Vitals and nursing note reviewed.  Constitutional:      Appearance: Normal appearance.  HENT:     Head: Normocephalic and atraumatic.  Eyes:     Extraocular Movements: Extraocular movements intact.     Pupils: Pupils are equal, round, and reactive to light.  Cardiovascular:     Rate and Rhythm: Normal rate.  Pulmonary:      Effort: Pulmonary effort is normal.  Skin:    General: Skin is warm and dry.  Neurological:     General: No focal deficit present.     Mental Status: She is alert and oriented to person, place, and time.     GCS: GCS eye subscore is 4. GCS verbal subscore is 5. GCS motor subscore is 6.     Cranial Nerves: No facial asymmetry.     Motor: No weakness.     Coordination: Romberg sign negative.  Psychiatric:        Mood and Affect: Mood normal.        Behavior: Behavior normal.      UC Treatments / Results  Labs (all labs ordered are listed, but only abnormal results are displayed) Labs Reviewed - No data to display  EKG   Radiology No results found.  Procedures Procedures (including critical care time)  Medications Ordered in UC Medications  ondansetron (ZOFRAN-ODT) disintegrating tablet 4 mg (4 mg Oral Given 04/12/22 1734)  ketorolac (TORADOL) 30 MG/ML injection 30 mg (30 mg Intramuscular Given 04/12/22 1734)    Initial Impression / Assessment and Plan / UC Course  I have reviewed the triage vital signs and the nursing notes.  Pertinent labs & imaging results that were available during my care of the patient were reviewed by me and considered in my medical decision making (see chart for details).      Reviewed exam and symptoms with patient.  No red flags Patient given Toradol IM in clinic.  She was monitored for 10 minutes after injection with no reaction noted and tolerated well.  Patient reports improvement in headache and now rates it as a 3-4 out of 10. she was instructed no NSAIDs for 24 hours and she verbalized understanding Zofran given in clinic, nausea resolved Discussed keeping headache diary and taking to her PCP for possible prescriptive migraine medication treatments Follow-up with PCP within 1 week ER precautions reviewed and patient verbalized understanding  Final diagnoses:  Other migraine with status migrainosus, intractable     Discharge  Instructions      You were given a Toradol injection in clinic today. Do not take any over the counter NSAID's such as Advil, ibuprofen, Aleve, or naproxen for  24 hours.  You may take tylenol if needed Keep a headache diary and take to your primary care Follow-up with your PCP within a week Please go to the emergency room if you have any worsening symptoms     ED Prescriptions   None    PDMP not reviewed this encounter.   Melynda Ripple, NP 04/12/22 [phone]

## 2022-04-12 NOTE — ED Triage Notes (Signed)
Pt c/o migraine, fatigue and nausea. Pt states it feels like there are needles poking her body.  Started: 3 days ago

## 2022-04-12 NOTE — Discharge Instructions (Addendum)
You were given a Toradol injection in clinic today. Do not take any over the counter NSAID's such as Advil, ibuprofen, Aleve, or naproxen for 24 hours.  You may take tylenol if needed Keep a headache diary and take to your primary care Follow-up with your PCP within a week Please go to the emergency room if you have any worsening symptoms

## 2022-04-27 ENCOUNTER — Ambulatory Visit (INDEPENDENT_AMBULATORY_CARE_PROVIDER_SITE_OTHER): Payer: No Payment, Other | Admitting: Clinical

## 2022-04-27 DIAGNOSIS — F410 Panic disorder [episodic paroxysmal anxiety] without agoraphobia: Secondary | ICD-10-CM | POA: Diagnosis not present

## 2022-04-27 DIAGNOSIS — F411 Generalized anxiety disorder: Secondary | ICD-10-CM | POA: Diagnosis not present

## 2022-04-27 NOTE — Progress Notes (Signed)
THERAPIST PROGRESS NOTE Virtual Visit via Video Note  I connected with Rebecca Reyes on 04/27/2022 at  9:00 AM EDT by a video enabled telemedicine application and verified that I am speaking with the correct person using two identifiers.  Location: Patient: home Provider: office   I discussed the limitations of evaluation and management by telemedicine and the availability of in person appointments. The patient expressed understanding and agreed to proceed.   Follow Up Instructions: I discussed the assessment and treatment plan with the patient. The patient was provided an opportunity to ask questions and all were answered. The patient agreed with the plan and demonstrated an understanding of the instructions.   The patient was advised to call back or seek an in-person evaluation if the symptoms worsen or if the condition fails to improve as anticipated.    Session Time: 45 minutes  Participation Level: Active  Behavioral Response: CasualAlertIrritable  Type of Therapy: Individual Therapy  Treatment Goals addressed: client will practice problem solving skills 3 times per week for the next 4 weeks  ProgressTowards Goals: Not Progressing  Interventions: CBT and Supportive  Summary:  Rebecca Reyes is a 22 y.o. female who presents for the scheduled appointment oriented times five, appropriately dressed, and friendly. Client denied hallucinations and delusions. Client reported she is doing okay but has been feeling like she is regressing to a downward spiral. Client reported everything was going good and then things started going negatively for her emotionally.  Client relates she has a pattern of behavior while she will be in a good space, have a job, be doing well financially.  Client reported with her most recent job with [employer] she got to be an emotional point of not caring to perform as she should and told her bosses that she is not trying to quit her job and they would have  to let her go.  Client reported she was ultimately terminated from her position.  Client reported it could have been prevented had she done better with communication and trying to maintain herself better.  Client reported she has not been putting in job applications everywhere within [place] of her home because she does not have the money to constantly pay for it over.  Client reported that was another one of her stressors is trying to save to get a car.  Client reported her current living situation continues to be stressful for her.  Client reported she has been broken up from her boyfriend at least 6 months but with them living together there.  Sometime when they act like they are in a relationship and then he changes on her.  Client reported it causes thoughts and feelings of confusion for her.  Client reported her emotions have also negatively impacted her friendships at 1 point or another when she becomes irritable she will stop talking to everyone for a long period of time. Evidence of progress towards goal: Client reported 1 negative thought pattern and believes it relates to difficulty with managing impulse control that negatively affects her occupational and interpersonal relationships.  Suicidal/Homicidal: Nowithout intent/plan  Therapist Response:  Therapist began the appointment asking the client how she has been doing since last seen. Therapist used CBT to engage using active listening and positive emotional support. Therapist used CBT to have the client to describe precedent events that cause to her progressive negative emotional state. Therapist used CBT to discuss effective communication skills to prevent explosive outburst and reframing negative thoughts to help  with problem solving. Therapist used CBT ask the client to identify her progress with frequency of use with coping skills with continued practice in her daily activity.    Therapist assigned client homework to continue applying  job applications and utilizing positive activities to help her cope with negative emotions.    Plan: Return again in 4 weeks.  Diagnosis: generalized anxiety disorder with panic attacks  Collaboration of Care: Patient refused AEB none requested by the client.  Patient/Guardian was advised Release of Information must be obtained prior to any record release in order to collaborate their care with an outside provider. Patient/Guardian was advised if they have not already done so to contact the registration department to sign all necessary forms in order for [place] to release information regarding their care.   Consent: Patient/Guardian gives verbal consent for treatment and assignment of benefits for services provided during this visit. Patient/Guardian expressed understanding and agreed to proceed.   [place], LCSW 04/27/2022

## 2022-05-17 ENCOUNTER — Other Ambulatory Visit (HOSPITAL_COMMUNITY): Payer: Self-pay

## 2022-05-24 ENCOUNTER — Other Ambulatory Visit (HOSPITAL_COMMUNITY): Payer: Self-pay | Admitting: Primary Care

## 2022-05-24 ENCOUNTER — Other Ambulatory Visit (HOSPITAL_COMMUNITY): Payer: Self-pay

## 2022-05-24 ENCOUNTER — Other Ambulatory Visit: Payer: Self-pay

## 2022-05-24 DIAGNOSIS — F410 Panic disorder [episodic paroxysmal anxiety] without agoraphobia: Secondary | ICD-10-CM

## 2022-05-25 ENCOUNTER — Other Ambulatory Visit (HOSPITAL_COMMUNITY): Payer: Self-pay

## 2022-05-25 ENCOUNTER — Other Ambulatory Visit: Payer: Self-pay

## 2022-05-25 NOTE — Telephone Encounter (Signed)
Requested medications are due for refill today.  unsure  Requested medications are on the active medications list.  yes  Last refill. 1/4/024 #30 1 rf  Future visit scheduled.   no  Notes to clinic.  Rx signed by Park Pope. Rx is expired.    Requested Prescriptions  Pending Prescriptions Disp Refills   venlafaxine XR (EFFEXOR XR) 75 MG 24 hr capsule 30 capsule 1    Sig: Take 1 capsule (75 mg total) by mouth daily with breakfast.     There is no refill protocol information for this order

## 2022-05-26 ENCOUNTER — Other Ambulatory Visit: Payer: Self-pay

## 2022-05-26 ENCOUNTER — Telehealth (HOSPITAL_COMMUNITY): Payer: Self-pay | Admitting: *Deleted

## 2022-05-26 DIAGNOSIS — F410 Panic disorder [episodic paroxysmal anxiety] without agoraphobia: Secondary | ICD-10-CM

## 2022-05-26 MED ORDER — HYDROXYZINE HCL 10 MG PO TABS
10.0000 mg | ORAL_TABLET | Freq: Three times a day (TID) | ORAL | 2 refills | Status: DC | PRN
  Filled 2022-05-26: qty 60, 20d supply, fill #0

## 2022-05-26 MED ORDER — VENLAFAXINE HCL ER 75 MG PO CP24
75.0000 mg | ORAL_CAPSULE | Freq: Every day | ORAL | 1 refills | Status: DC
  Filled 2022-05-26: qty 30, 30d supply, fill #0

## 2022-05-26 NOTE — Telephone Encounter (Signed)
Patient called asking for refills of Effexor and Atarax. Chart reviewed, no future appointment scheduled. Patient also has been out of Effexor she states since April 3rd. Transferred to front desk to make appointment with MD.

## 2022-05-26 NOTE — Telephone Encounter (Signed)
Acknowledged.   Last seen 02/16/22.  No Show 03/30/22. New Appointment made for 06/22/22.    Will send 30 day refill of effexor and hydroxyzine to [facility]. If miss next appointment, patient will need to return to open access.  Park Pope, MD Psychiatry Resident, PGY2

## 2022-05-31 ENCOUNTER — Encounter (INDEPENDENT_AMBULATORY_CARE_PROVIDER_SITE_OTHER): Payer: Self-pay | Admitting: Primary Care

## 2022-05-31 ENCOUNTER — Other Ambulatory Visit (HOSPITAL_COMMUNITY)
Admission: RE | Admit: 2022-05-31 | Discharge: 2022-05-31 | Disposition: A | Discharge status: Home / Self Care | Payer: Self-pay | Source: Ambulatory Visit | Attending: Primary Care | Admitting: Primary Care

## 2022-05-31 ENCOUNTER — Ambulatory Visit (INDEPENDENT_AMBULATORY_CARE_PROVIDER_SITE_OTHER): Payer: Self-pay | Admitting: Primary Care

## 2022-05-31 VITALS — BP 117/75 | HR 81 | Resp 16 | Ht 60.0 in | Wt 172.0 lb

## 2022-05-31 DIAGNOSIS — Z124 Encounter for screening for malignant neoplasm of cervix: Secondary | ICD-10-CM | POA: Insufficient documentation

## 2022-06-02 LAB — CERVICOVAGINAL ANCILLARY ONLY
Bacterial Vaginitis (gardnerella): POSITIVE — AB
Candida Glabrata: NEGATIVE
Candida Vaginitis: NEGATIVE
Chlamydia: NEGATIVE
Comment: NEGATIVE
Comment: NEGATIVE
Comment: NEGATIVE
Comment: NEGATIVE
Comment: NEGATIVE
Comment: NORMAL
Neisseria Gonorrhea: NEGATIVE
Trichomonas: NEGATIVE

## 2022-06-02 LAB — CYTOLOGY - PAP
Chlamydia: NEGATIVE
Comment: NEGATIVE
Comment: NEGATIVE
Comment: NORMAL
Diagnosis: NEGATIVE
Neisseria Gonorrhea: NEGATIVE
Trichomonas: NEGATIVE

## 2022-06-04 NOTE — Progress Notes (Signed)
  [facility]  WELL-WOMAN PHYSICAL & PAP Patient name: Rebecca Reyes MRN [id number]  Date of birth: [birth year] Chief Complaint:   Annual Exam and Gynecologic Exam  History of Present Illness:   Rebecca Reyes is a 22 y.o. No obstetric history on file. female being seen today for a routine well-woman exam.   [facility]  The current method of family planning is condoms.  Patient's last menstrual period was 05/13/2022. Last pap never  . Family h/o breast cancer: Yes Last colonoscopy: . Family h/o colorectal cancer: No  Review of Systems:    Denies any headaches, blurred vision, fatigue, shortness of breath, chest pain, abdominal pain, abnormal vaginal discharge/itching/odor/irritation, problems with periods, bowel movements, urination, or intercourse unless otherwise stated above.  Pertinent History Reviewed:   Reviewed past medical,surgical, social and family history.  Reviewed problem list, medications and allergies.  Physical Assessment:   Vitals:   05/31/22 1541  BP: 117/75  Pulse: 81  Resp: 16  SpO2: 100%  Weight: 172 lb (78 kg)  Height: 5' (1.524 m)  Body mass index is 33.59 kg/m.        Physical Examination:  General appearance - well appearing, and in no distress Mental status - alert, oriented to person, place, and time Psych:  She has a normal mood and affect Skin - warm and dry, normal color, no suspicious lesions noted Chest - effort normal, all lung fields clear to auscultation bilaterally Heart - normal rate and regular rhythm Neck:  midline trachea, no thyromegaly or nodules Breasts - breasts appear normal, no suspicious masses, no skin or nipple changes or axillary nodes Educated patient on proper self breast examination and had patient to demonstrate SBE. Abdomen - soft, nontender, nondistended, no masses or organomegaly Pelvic-VULVA: normal appearing vulva with no masses, tenderness or lesions   VAGINA: normal appearing vagina with  normal color and discharge, no lesions   CERVIX: normal appearing cervix without discharge or lesions, no CMT UTERUS: uterus is felt to be normal size, shape, consistency and nontender  ADNEXA: No adnexal masses or tenderness noted. Extremities:  No swelling or varicosities noted  No results found for this or any previous visit (from the past 24 hour(s)).   Assessment & Plan:  [name] was seen today for annual exam and gynecologic exam.  Diagnoses and all orders for this visit:  Cervical cancer screening -     Cytology - PAP -     Cervicovaginal ancillary only    Follow-up:  This note has been created with Dragon speech recognition software and smart Lobbyist. Any transcriptional errors are unintentional.   Grayce Sessions, NP 06/04/2022, 10:41 PM

## 2022-06-05 ENCOUNTER — Other Ambulatory Visit (INDEPENDENT_AMBULATORY_CARE_PROVIDER_SITE_OTHER): Payer: Self-pay | Admitting: Primary Care

## 2022-06-05 DIAGNOSIS — N760 Acute vaginitis: Secondary | ICD-10-CM

## 2022-06-05 MED ORDER — METRONIDAZOLE 500 MG PO TABS
500.0000 mg | ORAL_TABLET | Freq: Two times a day (BID) | ORAL | 0 refills | Status: DC
Start: 2022-06-05 — End: 2022-07-02

## 2022-06-07 ENCOUNTER — Ambulatory Visit (HOSPITAL_COMMUNITY): Payer: Self-pay | Admitting: Clinical

## 2022-06-07 ENCOUNTER — Encounter (HOSPITAL_COMMUNITY): Payer: Self-pay

## 2022-06-22 ENCOUNTER — Other Ambulatory Visit: Payer: Self-pay

## 2022-06-22 ENCOUNTER — Ambulatory Visit (INDEPENDENT_AMBULATORY_CARE_PROVIDER_SITE_OTHER): Payer: No Payment, Other | Admitting: Student

## 2022-06-22 VITALS — BP 120/78 | HR 85 | Ht 60.0 in | Wt 171.2 lb

## 2022-06-22 DIAGNOSIS — F1211 Cannabis abuse, in remission: Secondary | ICD-10-CM

## 2022-06-22 DIAGNOSIS — F410 Panic disorder [episodic paroxysmal anxiety] without agoraphobia: Secondary | ICD-10-CM

## 2022-06-22 DIAGNOSIS — F411 Generalized anxiety disorder: Secondary | ICD-10-CM

## 2022-06-22 MED ORDER — HYDROXYZINE HCL 10 MG PO TABS
10.0000 mg | ORAL_TABLET | Freq: Three times a day (TID) | ORAL | 2 refills | Status: DC | PRN
  Filled 2022-06-22: qty 60, 20d supply, fill #0

## 2022-06-22 MED ORDER — VENLAFAXINE HCL ER 75 MG PO CP24
75.0000 mg | ORAL_CAPSULE | Freq: Every day | ORAL | 2 refills | Status: DC
Start: 2022-06-22 — End: 2022-07-02
  Filled 2022-06-22: qty 30, 30d supply, fill #0
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
## 2022-06-29 ENCOUNTER — Other Ambulatory Visit: Payer: Self-pay

## 2022-07-02 ENCOUNTER — Ambulatory Visit (HOSPITAL_COMMUNITY)
Admission: EM | Admit: 2022-07-02 | Discharge: 2022-07-02 | Disposition: A | Discharge status: Home / Self Care | Payer: Self-pay | Attending: Emergency Medicine | Admitting: Emergency Medicine

## 2022-07-02 ENCOUNTER — Encounter (HOSPITAL_COMMUNITY): Payer: Self-pay

## 2022-07-02 DIAGNOSIS — N10 Acute tubulo-interstitial nephritis: Secondary | ICD-10-CM | POA: Insufficient documentation

## 2022-07-02 DIAGNOSIS — N3001 Acute cystitis with hematuria: Secondary | ICD-10-CM | POA: Insufficient documentation

## 2022-07-02 LAB — POCT URINALYSIS DIP (MANUAL ENTRY)
Bilirubin, UA: NEGATIVE
Glucose, UA: NEGATIVE mg/dL
Ketones, POC UA: NEGATIVE mg/dL
Nitrite, UA: POSITIVE — AB
Protein Ur, POC: 100 mg/dL — AB
Spec Grav, UA: 1.02 (ref 1.010–1.025)
Urobilinogen, UA: 1 E.U./dL
pH, UA: 5.5 (ref 5.0–8.0)

## 2022-07-02 LAB — POCT URINE PREGNANCY: Preg Test, Ur: NEGATIVE

## 2022-07-02 MED ORDER — CEFTRIAXONE SODIUM 1 G IJ SOLR
INTRAMUSCULAR | Status: AC
  Filled 2022-07-02: qty 10

## 2022-07-02 MED ORDER — CEPHALEXIN 500 MG PO CAPS: 500.0000 mg | ORAL_CAPSULE | Freq: Two times a day (BID) | ORAL | 0 refills | Status: AC

## 2022-07-02 MED ORDER — ONDANSETRON 4 MG PO TBDP: 4.0000 mg | ORAL_TABLET | Freq: Four times a day (QID) | ORAL | 0 refills | Status: DC | PRN

## 2022-07-02 MED ORDER — LIDOCAINE HCL (PF) 1 % IJ SOLN
INTRAMUSCULAR | Status: AC
  Filled 2022-07-02: qty 2

## 2022-07-02 MED ORDER — CEFTRIAXONE SODIUM 1 G IJ SOLR
1.0000 g | Freq: Once | INTRAMUSCULAR | Status: AC
  Administered 2022-07-02: 1 g via INTRAMUSCULAR

## 2022-07-02 NOTE — Discharge Instructions (Addendum)
I have given you an injection of an antibiotic today to "kick-start" your treatment.  You will still need to take an antibiotic by mouth.  Starting tomorrow morning, take the Keflex every 12 hours for a full 7 days.  Make sure you finish the full course! Drink lots of fluids.   If at any point your symptoms worsen or become severe you need to go directly to the emergency department.  I recommend to take the antibiotic with food to avoid further upsetting your stomach.  You can take the Zofran nausea medicine about 30 minutes before eating to help keep the stomach settled.  We will call you if anything on your swab returns positive. Please abstain from sexual intercourse until your results return.

## 2022-07-02 NOTE — ED Provider Notes (Signed)
[facility]    CSN: [id number] Arrival date & time: 07/02/22  1621     History   Chief Complaint Chief Complaint  Patient presents with   Dysuria   Urinary Frequency    HPI Rebecca Reyes is a 22 y.o. female.  1 week history of dysuria and urinary frequency. Lower abdominal pain worsening today. Occasional nausea with 2 episodes of vomiting yesterday after eating. She has been able to tolerate fluids. No fever but was feeling very hot today. Some low back pain bilaterally. No overt flank pain Denies vaginal discharge  LMP 5/1 Tried ibuprofen for abd pain, temporary relief   Past Medical History:  Diagnosis Date   Anxiety     Patient Active Problem List   Diagnosis Date Noted   Cannabis use disorder, mild, in early remission 06/22/2022   Generalized anxiety disorder with panic attacks 01/27/2022    History reviewed. No pertinent surgical history.  OB History   No obstetric history on file.      Home Medications    Prior to Admission medications   Medication Sig Start Date End Date Taking? Authorizing Provider  cephALEXin (KEFLEX) 500 MG capsule Take 1 capsule (500 mg total) by mouth 2 (two) times daily for 7 days. 07/02/22 07/09/22 Yes Raydel Hosick, Lurena Joiner, PA-C  ondansetron (ZOFRAN-ODT) 4 MG disintegrating tablet Take 1 tablet (4 mg total) by mouth every 6 (six) hours as needed for nausea or vomiting. 07/02/22  Yes Edder Bellanca, Lurena Joiner, PA-C  hydrOXYzine (ATARAX) 10 MG tablet Take 1 tablet (10 mg total) by mouth every 8 (eight) hours as needed for anxiety (or sleep). 06/22/22   Park Pope, MD    Family History Family History  Problem Relation Age of Onset   Bipolar disorder Maternal Aunt    Schizophrenia Maternal Aunt    Drug abuse Maternal Aunt    Bipolar disorder Maternal Uncle    Bipolar disorder Maternal Grandmother    Schizophrenia Maternal Grandmother     Social History Social History   Tobacco Use   Smoking status: Never   Smokeless tobacco:  Never  Vaping Use   Vaping Use: Never used  Substance Use Topics   Alcohol use: No   Drug use: Not Currently    Types: Marijuana     Allergies   Patient has no known allergies.   Review of Systems Review of Systems As per HPI  Physical Exam Triage Vital Signs ED Triage Vitals  Enc Vitals Group     BP 07/02/22 1710 99/67     Pulse Rate 07/02/22 1710 96     Resp 07/02/22 1710 14     Temp 07/02/22 1710 98.5 F (36.9 C)     Temp Source 07/02/22 1710 Oral     SpO2 07/02/22 1710 97 %     Weight --      Height --      Head Circumference --      Peak Flow --      Pain Score 07/02/22 1712 8     Pain Loc --      Pain Edu? --      Excl. in GC? --    No data found.  Updated Vital Signs BP 99/67 (BP Location: Left Arm)   Pulse 96   Temp 98.5 F (36.9 C) (Oral)   Resp 14   LMP 06/14/2022 (Approximate)   SpO2 97%   Physical Exam Vitals and nursing note reviewed.  Constitutional:      General:  She is not in acute distress. HENT:     Mouth/Throat:     Mouth: Mucous membranes are moist.     Pharynx: Oropharynx is clear.  Eyes:     Conjunctiva/sclera: Conjunctivae normal.     Pupils: Pupils are equal, round, and reactive to light.  Cardiovascular:     Rate and Rhythm: Normal rate and regular rhythm.     Heart sounds: Normal heart sounds.  Pulmonary:     Effort: Pulmonary effort is normal.     Breath sounds: Normal breath sounds.  Abdominal:     General: Bowel sounds are normal.     Palpations: Abdomen is soft.     Tenderness: There is abdominal tenderness in the right lower quadrant and suprapubic area. There is right CVA tenderness (mild). There is no left CVA tenderness, guarding or rebound.  Neurological:     Mental Status: She is alert and oriented to person, place, and time.     UC Treatments / Results  Labs (all labs ordered are listed, but only abnormal results are displayed) Labs Reviewed  POCT URINALYSIS DIP (MANUAL ENTRY) - Abnormal; Notable for  the following components:      Result Value   Color, UA orange (*)    Blood, UA moderate (*)    Protein Ur, POC =100 (*)    Nitrite, UA Positive (*)    Leukocytes, UA Moderate (2+) (*)    All other components within normal limits  URINE CULTURE  POCT URINE PREGNANCY  CERVICOVAGINAL ANCILLARY ONLY    EKG  Radiology No results found.  Procedures Procedures   Medications Ordered in UC Medications  cefTRIAXone (ROCEPHIN) injection 1 g (1 g Intramuscular Given 07/02/22 1757)    Initial Impression / Assessment and Plan / UC Course  I have reviewed the triage vital signs and the nursing notes.  Pertinent labs & imaging results that were available during my care of the patient were reviewed by me and considered in my medical decision making (see chart for details).  UPT negative UA moderate leuks, +nitrates, moderate blood. Culture pending.  Concern for ascending infection given the mild right CVA tenderness and her tactile fever. Although unsure if this is muscular pain radiating from low back. Will cover for pyelo.  Rocephin 1g given in clinic. Keflex BID x 7 days. Zofran q6 hours prn, increase fluids Also sending cytology swab to r/o as cause of RLQ discomfort.  Strict ED precautions for any worsening of symptoms.  Patient agrees to plan.  No questions at this time.  Final Clinical Impressions(s) / UC Diagnoses   Final diagnoses:  Acute cystitis with hematuria  Acute pyelonephritis     Discharge Instructions      I have given you an injection of an antibiotic today to "kick-start" your treatment.  You will still need to take an antibiotic by mouth.  Starting tomorrow morning, take the Keflex every 12 hours for a full 7 days.  Make sure you finish the full course! Drink lots of fluids.   If at any point your symptoms worsen or become severe you need to go directly to the emergency department.  I recommend to take the antibiotic with food to avoid further upsetting your  stomach.  You can take the Zofran nausea medicine about 30 minutes before eating to help keep the stomach settled.  We will call you if anything on your swab returns positive. Please abstain from sexual intercourse until your results return.  ED Prescriptions     Medication Sig Dispense Auth. Provider   ondansetron (ZOFRAN-ODT) 4 MG disintegrating tablet Take 1 tablet (4 mg total) by mouth every 6 (six) hours as needed for nausea or vomiting. 20 tablet Kelaiah Escalona, PA-C   cephALEXin (KEFLEX) 500 MG capsule Take 1 capsule (500 mg total) by mouth 2 (two) times daily for 7 days. 14 capsule Lexx Monte, Lurena Joiner, PA-C      PDMP not reviewed this encounter.   Jermya Dowding, Ray Church 07/02/22 1811

## 2022-07-02 NOTE — ED Triage Notes (Signed)
Patient c/o dysuria and urinary frequency x 1 week. Patient also c/o N/v x 2 days.  Patient has not had any meds for her symptoms.

## 2022-07-03 LAB — URINE CULTURE

## 2022-07-04 LAB — CERVICOVAGINAL ANCILLARY ONLY
Bacterial Vaginitis (gardnerella): POSITIVE — AB
Candida Glabrata: NEGATIVE
Candida Vaginitis: NEGATIVE
Chlamydia: NEGATIVE
Comment: NEGATIVE
Comment: NEGATIVE
Comment: NEGATIVE
Comment: NEGATIVE
Comment: NEGATIVE
Comment: NORMAL
Neisseria Gonorrhea: NEGATIVE
Trichomonas: NEGATIVE

## 2022-07-04 LAB — URINE CULTURE: Culture: >=100000 — AB

## 2022-07-05 ENCOUNTER — Telehealth (HOSPITAL_COMMUNITY): Payer: Self-pay | Admitting: Emergency Medicine

## 2022-07-05 MED ORDER — METRONIDAZOLE 500 MG PO TABS
500.0000 mg | ORAL_TABLET | Freq: Two times a day (BID) | ORAL | 0 refills | Status: DC
Start: 2022-07-05 — End: 2022-12-26

## 2022-07-06 ENCOUNTER — Ambulatory Visit (HOSPITAL_COMMUNITY): Payer: No Payment, Other | Admitting: Clinical

## 2022-08-03 ENCOUNTER — Ambulatory Visit (HOSPITAL_COMMUNITY): Payer: No Payment, Other | Admitting: Clinical

## 2022-08-03 ENCOUNTER — Other Ambulatory Visit: Payer: Self-pay

## 2022-08-03 ENCOUNTER — Encounter (HOSPITAL_COMMUNITY): Payer: Self-pay
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
## 2022-08-24 ENCOUNTER — Encounter (HOSPITAL_COMMUNITY): Payer: No Payment, Other | Admitting: Student

## 2022-08-24 DIAGNOSIS — F1211 Cannabis abuse, in remission: Secondary | ICD-10-CM

## 2022-08-24 DIAGNOSIS — F410 Panic disorder [episodic paroxysmal anxiety] without agoraphobia: Secondary | ICD-10-CM
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
## 2022-10-10 ENCOUNTER — Ambulatory Visit (INDEPENDENT_AMBULATORY_CARE_PROVIDER_SITE_OTHER): Payer: No Payment, Other | Admitting: Student

## 2022-10-10 ENCOUNTER — Encounter (HOSPITAL_COMMUNITY): Payer: Self-pay | Admitting: Student

## 2022-10-10 DIAGNOSIS — F411 Generalized anxiety disorder: Secondary | ICD-10-CM | POA: Diagnosis not present

## 2022-10-10 DIAGNOSIS — F410 Panic disorder [episodic paroxysmal anxiety] without agoraphobia: Secondary | ICD-10-CM | POA: Diagnosis not present

## 2022-10-10 DIAGNOSIS — F1211 Cannabis abuse, in remission: Secondary | ICD-10-CM | POA: Diagnosis not present

## 2022-10-13 NOTE — Addendum Note (Signed)
Addended by: Theodoro Kos A on: 10/13/2022 07:26 AM   Modules accepted: Level of Service

## 2022-10-15 ENCOUNTER — Encounter (HOSPITAL_COMMUNITY): Payer: Self-pay

## 2022-10-15 ENCOUNTER — Ambulatory Visit (HOSPITAL_COMMUNITY)
Admission: EM | Admit: 2022-10-15 | Discharge: 2022-10-15 | Disposition: A | Discharge status: Home / Self Care | Payer: Self-pay | Attending: Nurse Practitioner | Admitting: Nurse Practitioner

## 2022-10-15 DIAGNOSIS — J069 Acute upper respiratory infection, unspecified: Secondary | ICD-10-CM | POA: Insufficient documentation

## 2022-10-15 DIAGNOSIS — Z1152 Encounter for screening for COVID-19: Secondary | ICD-10-CM | POA: Insufficient documentation

## 2022-10-15 MED ORDER — PROMETHAZINE-DM 6.25-15 MG/5ML PO SYRP: 5.0000 mL | ORAL_SOLUTION | Freq: Every evening | ORAL | 0 refills | Status: DC | PRN

## 2022-10-15 MED ORDER — BENZONATATE 100 MG PO CAPS: 100.0000 mg | ORAL_CAPSULE | Freq: Three times a day (TID) | ORAL | 0 refills | Status: DC | PRN

## 2022-10-15 MED ORDER — ONDANSETRON 4 MG PO TBDP: 4.0000 mg | ORAL_TABLET | Freq: Three times a day (TID) | ORAL | 0 refills | Status: DC | PRN

## 2022-10-15 NOTE — ED Triage Notes (Signed)
Patient here today with c/o cough, congestion, ST, body aches, headache, N&V since Wednesday. She vomited twice yesterday and once on Friday. She has been taking cold medicine, nasal spray, and drinking hot tea with some relief. Her friend went on a cruise a month ago and came back with a cold.

## 2022-10-15 NOTE — ED Provider Notes (Signed)
[facility]    CSN: [id number] Arrival date & time: 10/15/22  1451      History   Chief Complaint Chief Complaint  Patient presents with   Cough    HPI Rebecca GERDEMAN is a 22 y.o. female.   Patient presents today for 4-day history of bodyaches, cough that is minimally productive, stuffy nose, sore throat, headache, nausea and vomiting, and fatigue.  She denies known fevers, shortness of breath or chest pain, runny nose, ear pain, abdominal pain, diarrhea, change in appetite, and new rash.  Denies any episodes of vomiting today so far.  Has been taking over-the-counter cold medications, nasal spray, and drinking hot tea with minimal improvement in symptoms.    Past Medical History:  Diagnosis Date   Anxiety     Patient Active Problem List   Diagnosis Date Noted   Cannabis use disorder, mild, in sustained remission 06/22/2022   Generalized anxiety disorder with panic attacks 01/27/2022    History reviewed. No pertinent surgical history.  OB History   No obstetric history on file.      Home Medications    Prior to Admission medications   Medication Sig Start Date End Date Taking? Authorizing Provider  benzonatate (TESSALON) 100 MG capsule Take 1 capsule (100 mg total) by mouth 3 (three) times daily as needed for cough. Do not take with alcohol or while driving or operating heavy machinery.  May cause drowsiness. 10/15/22  Yes Cathlean Marseilles A, NP  ondansetron (ZOFRAN-ODT) 4 MG disintegrating tablet Take 1 tablet (4 mg total) by mouth every 8 (eight) hours as needed for nausea or vomiting. 10/15/22  Yes Valentino Nose, NP  promethazine-dextromethorphan (PROMETHAZINE-DM) 6.25-15 MG/5ML syrup Take 5 mLs by mouth at bedtime as needed for cough. Do not take with alcohol or while driving or operating heavy machinery.  May cause drowsiness. 10/15/22  Yes Valentino Nose, NP  hydrOXYzine (ATARAX) 10 MG tablet Take 1 tablet (10 mg total) by mouth every 8 (eight)  hours as needed for anxiety (or sleep). 06/22/22   Park Pope, MD  metroNIDAZOLE (FLAGYL) 500 MG tablet Take 1 tablet (500 mg total) by mouth 2 (two) times daily. 07/05/22   LampteyBritta Mccreedy, MD    Family History Family History  Problem Relation Age of Onset   Bipolar disorder Maternal Aunt    Schizophrenia Maternal Aunt    Drug abuse Maternal Aunt    Bipolar disorder Maternal Uncle    Bipolar disorder Maternal Grandmother    Schizophrenia Maternal Grandmother     Social History Social History   Tobacco Use   Smoking status: Never   Smokeless tobacco: Never  Vaping Use   Vaping status: Never Used  Substance Use Topics   Alcohol use: No   Drug use: Not Currently    Types: Marijuana     Allergies   Patient has no known allergies.   Review of Systems Review of Systems Per HPI  Physical Exam Triage Vital Signs ED Triage Vitals  Encounter Vitals Group     BP 10/15/22 1616 123/80     Systolic BP Percentile --      Diastolic BP Percentile --      Pulse Rate 10/15/22 1616 72     Resp 10/15/22 1616 16     Temp 10/15/22 1616 99 F (37.2 C)     Temp Source 10/15/22 1616 Oral     SpO2 10/15/22 1616 98 %     Weight 10/15/22 1616  170 lb (77.1 kg)     Height 10/15/22 1616 5\' 2"  (1.575 m)     Head Circumference --      Peak Flow --      Pain Score 10/15/22 1615 7     Pain Loc --      Pain Education --      Exclude from Growth Chart --    No data found.  Updated Vital Signs BP 123/80 (BP Location: Right Arm)   Pulse 72   Temp 99 F (37.2 C) (Oral)   Resp 16   Ht 5\' 2"  (1.575 m)   Wt 170 lb (77.1 kg)   LMP 09/25/2022 (Approximate)   SpO2 98%   BMI 31.09 kg/m   Visual Acuity Right Eye Distance:   Left Eye Distance:   Bilateral Distance:    Right Eye Near:   Left Eye Near:    Bilateral Near:     Physical Exam Vitals and nursing note reviewed.  Constitutional:      General: She is not in acute distress.    Appearance: Normal appearance. She is not  ill-appearing or toxic-appearing.  HENT:     Head: Normocephalic and atraumatic.     Right Ear: Tympanic membrane, ear canal and external ear normal.     Left Ear: Tympanic membrane, ear canal and external ear normal.     Nose: Congestion present. No rhinorrhea.     Mouth/Throat:     Mouth: Mucous membranes are moist.     Pharynx: Oropharynx is clear. No oropharyngeal exudate or posterior oropharyngeal erythema.  Eyes:     General: No scleral icterus.    Extraocular Movements: Extraocular movements intact.  Cardiovascular:     Rate and Rhythm: Normal rate and regular rhythm.  Pulmonary:     Effort: Pulmonary effort is normal. No respiratory distress.     Breath sounds: Normal breath sounds. No wheezing, rhonchi or rales.  Abdominal:     General: Abdomen is flat. Bowel sounds are normal. There is no distension.     Palpations: Abdomen is soft.  Musculoskeletal:     Cervical back: Normal range of motion and neck supple.  Lymphadenopathy:     Cervical: No cervical adenopathy.  Skin:    General: Skin is warm and dry.     Coloration: Skin is not jaundiced or pale.     Findings: No erythema or rash.  Neurological:     Mental Status: She is alert and oriented to person, place, and time.  Psychiatric:        Behavior: Behavior is cooperative.      UC Treatments / Results  Labs (all labs ordered are listed, but only abnormal results are displayed) Labs Reviewed  SARS CORONAVIRUS 2 (TAT 6-24 HRS)    EKG   Radiology No results found.  Procedures Procedures (including critical care time)  Medications Ordered in UC Medications - No data to display  Initial Impression / Assessment and Plan / UC Course  I have reviewed the triage vital signs and the nursing notes.  Pertinent labs & imaging results that were available during my care of the patient were reviewed by me and considered in my medical decision making (see chart for details).   Patient is well-appearing,  normotensive, afebrile, not tachycardic, not tachypneic, oxygenating well on room air.    1. Viral URI with cough 2. Encounter for screening for COVID-19 Suspect viral etiology Vitals and exam today are reassuring COVID-19 testing obtained Supportive care  discussed ER and return precautions discussed  The patient was given the opportunity to ask questions.  All questions answered to their satisfaction.  The patient is in agreement to this plan.    Final Clinical Impressions(s) / UC Diagnoses   Final diagnoses:  Viral URI with cough  Encounter for screening for COVID-19     Discharge Instructions      You have a viral upper respiratory infection.  Symptoms should improve over the next week to 10 days.  If you develop chest pain or shortness of breath, go to the emergency room.  We have tested you today for COVID-19.  You will see the results in Mychart and we will call you with positive results.  Please stay home and isolate until you are aware of the results.    Some things that can make you feel better are: - Increased rest - Increasing fluid with water/sugar free electrolytes - Acetaminophen and ibuprofen as needed for fever/pain - Salt water gargling, chloraseptic spray and throat lozenges - OTC guaifenesin (Mucinex) 600 mg twice daily - Saline sinus flushes or a neti pot - Humidifying the air -Tessalon Perles every 8 hours as needed for dry cough and cough syrup for night time     ED Prescriptions     Medication Sig Dispense Auth. Provider   benzonatate (TESSALON) 100 MG capsule Take 1 capsule (100 mg total) by mouth 3 (three) times daily as needed for cough. Do not take with alcohol or while driving or operating heavy machinery.  May cause drowsiness. 21 capsule Cathlean Marseilles A, NP   promethazine-dextromethorphan (PROMETHAZINE-DM) 6.25-15 MG/5ML syrup Take 5 mLs by mouth at bedtime as needed for cough. Do not take with alcohol or while driving or operating heavy  machinery.  May cause drowsiness. 118 mL Cathlean Marseilles A, NP   ondansetron (ZOFRAN-ODT) 4 MG disintegrating tablet Take 1 tablet (4 mg total) by mouth every 8 (eight) hours as needed for nausea or vomiting. 20 tablet Valentino Nose, NP      PDMP not reviewed this encounter.   Valentino Nose, NP 10/15/22 1714

## 2022-10-15 NOTE — Discharge Instructions (Addendum)
You have a viral upper respiratory infection.  Symptoms should improve over the next week to 10 days.  If you develop chest pain or shortness of breath, go to the emergency room.  We have tested you today for COVID-19.  You will see the results in Mychart and we will call you with positive results.  Please stay home and isolate until you are aware of the results.    Some things that can make you feel better are: - Increased rest - Increasing fluid with water/sugar free electrolytes - Acetaminophen and ibuprofen as needed for fever/pain - Salt water gargling, chloraseptic spray and throat lozenges - OTC guaifenesin (Mucinex) 600 mg twice daily - Saline sinus flushes or a neti pot - Humidifying the air -Tessalon Perles every 8 hours as needed for dry cough and cough syrup for night time

## 2022-10-16 LAB — SARS CORONAVIRUS 2 (TAT 6-24 HRS): SARS Coronavirus 2: POSITIVE — AB

## 2022-10-25 ENCOUNTER — Ambulatory Visit (INDEPENDENT_AMBULATORY_CARE_PROVIDER_SITE_OTHER): Payer: No Payment, Other | Admitting: Clinical

## 2022-10-25 DIAGNOSIS — F411 Generalized anxiety disorder: Secondary | ICD-10-CM | POA: Diagnosis not present

## 2022-10-25 DIAGNOSIS — F410 Panic disorder [episodic paroxysmal anxiety] without agoraphobia: Secondary | ICD-10-CM

## 2022-10-25 NOTE — Progress Notes (Signed)
THERAPIST PROGRESS NOTE Virtual Visit via Video Note  I connected with [name] on 10/25/22 at  2:00 PM EDT by a video enabled telemedicine application and verified that I am speaking with the correct person using two identifiers.  Location: Patient: home Provider: office   I discussed the limitations of evaluation and management by telemedicine and the availability of in person appointments. The patient expressed understanding and agreed to proceed.   Follow Up Instructions: I discussed the assessment and treatment plan with the patient. The patient was provided an opportunity to ask questions and all were answered. The patient agreed with the plan and demonstrated an understanding of the instructions.   The patient was advised to call back or seek an in-person evaluation if the symptoms worsen or if the condition fails to improve as anticipated.   Session Time: 30 minutes  Participation Level: Active  Behavioral Response: CasualAlertAnxious  Type of Therapy: Individual Therapy  Treatment Goals addressed: Client will practice problem-solving skills 3 times per week for the next 4 weeks  ProgressTowards Goals: Progressing  Interventions: CBT and Supportive  Summary:  NESSA GARCZYNSKI is a 22 y.o. female who presents for the scheduled appointment oriented x 5, appropriately dressed, and friendly.  Client denied hallucinations and delusions. Client reported on today she has been doing fairly well.  Client reported since she was last seen she has started a new job working for [employer] and she has started back in school.  Client reported she is going to school for business administration.  Client reported she was taking a full class load but she has downsized to 2 classes because it felt overwhelming with her work schedule.  Client reported she met with a psychiatrist and is figuring out some medications to help her anxiety.  Client reported she does need something to help her  focus.  Client reported she has thought about smoking marijuana but she is not going to because it causes her to feel paranoid like someone is out to get her.  Client reported that is not her baseline way of thinking.  Client reported she will take the therapist suggestion of reaching out to her primary care physician to see about some holistic supplements she can take to possibly help with feeling anxious.  Client reported she has been worried about her younger sister who is 31 and away in college.  Client reported her sister is already taking medications to help with mental health and ADHD.  Client reported her sister has vocalized thoughts of wanting to harm herself.  Client reported her mother has not been very supportive with her sister seeking options for treatment.  Client reported she is her sister's main support in that area.  Client reported otherwise for herself she is maintaining pretty well. Evidence of progress towards goal: Client reported 1 positive of utilizing organizational skills to help her stay focused at least 5 days/week.  Suicidal/Homicidal: Nowithout intent/plan  Therapist Response:  Therapist began the appointment asking the client how she has been doing since last seen. Therapist used CBT to engage using active listening and positive emotional support towards her thoughts and feelings. Therapist used CBT to engage the client and ask her about medication compliance compared to ongoing symptoms of anxiety. Therapist used CBT to ask the client open-ended questions about the current severity of her mood compared to ongoing stressors and how she is coping with it. Therapist used CBT to positively reinforce the clients self-care practices as well as  emotion regulation skills. Therapist used CBT ask the client to identify her progress with frequency of use with coping skills with continued practice in her daily activity.    Therapist assigned client homework to practice  self-care.   Plan: Return again in 4 weeks.  Diagnosis: Generalized anxiety disorder with panic attacks  Collaboration of Care: Patient refused AEB none requested by the client.  Patient/Guardian was advised Release of Information must be obtained prior to any record release in order to collaborate their care with an outside provider. Patient/Guardian was advised if they have not already done so to contact the registration department to sign all necessary forms in order for [place] to release information regarding their care.   Consent: Patient/Guardian gives verbal consent for treatment and assignment of benefits for services provided during this visit. Patient/Guardian expressed understanding and agreed to proceed.   Neena Rhymes Atianna Haidar, LCSW 10/25/2022

## 2022-12-26 ENCOUNTER — Ambulatory Visit (INDEPENDENT_AMBULATORY_CARE_PROVIDER_SITE_OTHER): Payer: Self-pay | Admitting: Primary Care

## 2022-12-26 ENCOUNTER — Other Ambulatory Visit (HOSPITAL_COMMUNITY)
Admission: RE | Admit: 2022-12-26 | Discharge: 2022-12-26 | Disposition: A | Discharge status: Home / Self Care | Payer: Self-pay | Source: Ambulatory Visit | Attending: Primary Care | Admitting: Primary Care

## 2022-12-26 ENCOUNTER — Encounter (INDEPENDENT_AMBULATORY_CARE_PROVIDER_SITE_OTHER): Payer: Self-pay | Admitting: Primary Care

## 2022-12-26 VITALS — BP 111/74 | HR 77 | Resp 16 | Ht 60.0 in | Wt 172.0 lb

## 2022-12-26 DIAGNOSIS — Z113 Encounter for screening for infections with a predominantly sexual mode of transmission: Secondary | ICD-10-CM | POA: Insufficient documentation

## 2022-12-26 DIAGNOSIS — Z7689 Persons encountering health services in other specified circumstances: Secondary | ICD-10-CM

## 2022-12-26 DIAGNOSIS — R300 Dysuria: Secondary | ICD-10-CM

## 2022-12-26 DIAGNOSIS — N898 Other specified noninflammatory disorders of vagina: Secondary | ICD-10-CM

## 2022-12-26 DIAGNOSIS — Z6833 Body mass index (BMI) 33.0-33.9, adult: Secondary | ICD-10-CM

## 2022-12-26 DIAGNOSIS — E66811 Obesity, class 1: Secondary | ICD-10-CM

## 2022-12-26 DIAGNOSIS — Z28310 Unvaccinated for covid-19: Secondary | ICD-10-CM

## 2022-12-26 DIAGNOSIS — Z2821 Immunization not carried out because of patient refusal: Secondary | ICD-10-CM

## 2022-12-26 DIAGNOSIS — L818 Other specified disorders of pigmentation: Secondary | ICD-10-CM

## 2022-12-26 DIAGNOSIS — E669 Obesity, unspecified: Secondary | ICD-10-CM

## 2022-12-26 LAB — POCT URINALYSIS DIP (CLINITEK)
Bilirubin, UA: NEGATIVE
Blood, UA: NEGATIVE
Glucose, UA: NEGATIVE mg/dL
Ketones, POC UA: NEGATIVE mg/dL
Leukocytes, UA: NEGATIVE
Nitrite, UA: NEGATIVE
POC PROTEIN,UA: NEGATIVE
Spec Grav, UA: 1.025 (ref 1.010–1.025)
Urobilinogen, UA: 1 U/dL
pH, UA: 7 (ref 5.0–8.0)

## 2022-12-26 NOTE — Progress Notes (Signed)
Exposure to STD  The patient's primary symptoms include a discharge and genital itching. This is a new problem. The current episode started in the past 7 days. The problem has been waxing and waning. The vaginal discharge was milky and thick. Associate symptoms include urinary frequency. She has tried nothing for the symptoms. Risk factors include history of STDs.

## 2022-12-26 NOTE — Progress Notes (Signed)
   Established Patient Office Visit  Subjective   Patient ID: Rebecca Reyes    DOB: 2000-10-07  Age: 22 y.o. MRN: [id number]  HPI  Exposure to STD  The patient's primary symptoms include a discharge and genital itching. This is a new problem. The current episode started in the past 7 days. The problem has been waxing and waning. The vaginal discharge was milky and thick. Associate symptoms include urinary frequency. She has tried nothing for the symptoms. Risk factors include history of STDs.      Concerned with tatoo maybe infected d/w s/s infection - itchy , redness , bumps, no temperature changed.    Active Ambulatory Problems    Diagnosis Date Noted   Generalized anxiety disorder with panic attacks 01/27/2022   Cannabis use disorder, mild, in sustained remission 06/22/2022   Resolved Ambulatory Problems    Diagnosis Date Noted   No Resolved Ambulatory Problems   Past Medical History:  Diagnosis Date   Anxiety      ROS  Comprehensive ROS Pertinent positive and negative noted in HPI     Objective:   BP 111/74   Pulse 77   Resp 16   Ht 5' (1.524 m)   Wt 172 lb (78 kg)   LMP 12/13/2022   SpO2 99%   BMI 33.59 kg/m    Physical Exam Vitals reviewed.  Constitutional:      Appearance: She is obese.  HENT:     Head: Normocephalic.     Right Ear: External ear normal.     Left Ear: External ear normal.     Nose: Nose normal.  Eyes:     Extraocular Movements: Extraocular movements intact.  Cardiovascular:     Rate and Rhythm: Normal rate and regular rhythm.  Pulmonary:     Effort: Pulmonary effort is normal.     Breath sounds: Normal breath sounds.  Abdominal:     General: Bowel sounds are normal. There is distension.     Palpations: Abdomen is soft.  Musculoskeletal:     Cervical back: Normal range of motion and neck supple.  Skin:    Comments: Center of back tatoo placed 12/19/22   Neurological:     Mental Status: She is alert.       No results found  for any visits on 12/22/21.  The ASCVD Risk score (Arnett DK, et al., 2019) failed to calculate for the following reasons:   The systolic blood pressure is missing   Cannot find a previous HDL lab   Cannot find a previous total cholesterol lab    Assessment & Plan:   Riana was seen today for weight management screening and std testing.  Diagnoses and all orders for this visit:  Vaginal itching -     Cancel: Cervicovaginal ancillary only -     Cervicovaginal ancillary only  Screening for STD (sexually transmitted disease) -     Cancel: Cervicovaginal ancillary only -     HIV Antibody (routine testing w rflx) -     Cervicovaginal ancillary only  Influenza vaccination declined  COVID-19 vaccine series declined     Grayce Sessions, NP

## 2022-12-26 NOTE — Patient Instructions (Signed)
Calorie Counting for Weight Loss Calories are units of energy. Your body needs a certain number of calories from food to keep going throughout the day. When you eat or drink more calories than your body needs, your body stores the extra calories mostly as fat. When you eat or drink fewer calories than your body needs, your body burns fat to get the energy it needs. Calorie counting means keeping track of how many calories you eat and drink each day. Calorie counting can be helpful if you need to lose weight. If you eat fewer calories than your body needs, you should lose weight. Ask your health care provider what a healthy weight is for you. For calorie counting to work, you will need to eat the right number of calories each day to lose a healthy amount of weight per week. A dietitian can help you figure out how many calories you need in a day and will suggest ways to reach your calorie goal. A healthy amount of weight to lose each week is usually 1-2 lb (0.5-0.9 kg). This usually means that your daily calorie intake should be reduced by 500-750 calories. Eating 1,200-1,500 calories a day can help most women lose weight. Eating 1,500-1,800 calories a day can help most men lose weight. What do I need to know about calorie counting? Work with your health care provider or dietitian to determine how many calories you should get each day. To meet your daily calorie goal, you will need to: Find out how many calories are in each food that you would like to eat. Try to do this before you eat. Decide how much of the food you plan to eat. Keep a food log. Do this by writing down what you ate and how many calories it had. To successfully lose weight, it is important to balance calorie counting with a healthy lifestyle that includes regular activity. Where do I find calorie information?  The number of calories in a food can be found on a Nutrition Facts label. If a food does not have a Nutrition Facts label, try  to look up the calories online or ask your dietitian for help. Remember that calories are listed per serving. If you choose to have more than one serving of a food, you will have to multiply the calories per serving by the number of servings you plan to eat. For example, the label on a package of bread might say that a serving size is 1 slice and that there are 90 calories in a serving. If you eat 1 slice, you will have eaten 90 calories. If you eat 2 slices, you will have eaten 180 calories. How do I keep a food log? After each time that you eat, record the following in your food log as soon as possible: What you ate. Be sure to include toppings, sauces, and other extras on the food. How much you ate. This can be measured in cups, ounces, or number of items. How many calories were in each food and drink. The total number of calories in the food you ate. Keep your food log near you, such as in a pocket-sized notebook or on an app or website on your mobile phone. Some programs will calculate calories for you and show you how many calories you have left to meet your daily goal. What are some portion-control tips? Know how many calories are in a serving. This will help you know how many servings you can have of a certain   food. Use a measuring cup to measure serving sizes. You could also try weighing out portions on a kitchen scale. With time, you will be able to estimate serving sizes for some foods. Take time to put servings of different foods on your favorite plates or in your favorite bowls and cups so you know what a serving looks like. Try not to eat straight from a food's packaging, such as from a bag or box. Eating straight from the package makes it hard to see how much you are eating and can lead to overeating. Put the amount you would like to eat in a cup or on a plate to make sure you are eating the right portion. Use smaller plates, glasses, and bowls for smaller portions and to prevent  overeating. Try not to multitask. For example, avoid watching TV or using your computer while eating. If it is time to eat, sit down at a table and enjoy your food. This will help you recognize when you are full. It will also help you be more mindful of what and how much you are eating. What are tips for following this plan? Reading food labels Check the calorie count compared with the serving size. The serving size may be smaller than what you are used to eating. Check the source of the calories. Try to choose foods that are high in protein, fiber, and vitamins, and low in saturated fat, trans fat, and sodium. Shopping Read nutrition labels while you shop. This will help you make healthy decisions about which foods to buy. Pay attention to nutrition labels for low-fat or fat-free foods. These foods sometimes have the same number of calories or more calories than the full-fat versions. They also often have added sugar, starch, or salt to make up for flavor that was removed with the fat. Make a grocery list of lower-calorie foods and stick to it. Cooking Try to cook your favorite foods in a healthier way. For example, try baking instead of frying. Use low-fat dairy products. Meal planning Use more fruits and vegetables. One-half of your plate should be fruits and vegetables. Include lean proteins, such as chicken, turkey, and fish. Lifestyle Each week, aim to do one of the following: 150 minutes of moderate exercise, such as walking. 75 minutes of vigorous exercise, such as running. General information Know how many calories are in the foods you eat most often. This will help you calculate calorie counts faster. Find a way of tracking calories that works for you. Get creative. Try different apps or programs if writing down calories does not work for you. What foods should I eat?  Eat nutritious foods. It is better to have a nutritious, high-calorie food, such as an avocado, than a food with  few nutrients, such as a bag of potato chips. Use your calories on foods and drinks that will fill you up and will not leave you hungry soon after eating. Examples of foods that fill you up are nuts and nut butters, vegetables, lean proteins, and high-fiber foods such as whole grains. High-fiber foods are foods with more than 5 g of fiber per serving. Pay attention to calories in drinks. Low-calorie drinks include water and unsweetened drinks. The items listed above may not be a complete list of foods and beverages you can eat. Contact a dietitian for more information. What foods should I limit? Limit foods or drinks that are not good sources of vitamins, minerals, or protein or that are high in unhealthy fats. These   include: Candy. Other sweets. Sodas, specialty coffee drinks, alcohol, and juice. The items listed above may not be a complete list of foods and beverages you should avoid. Contact a dietitian for more information. How do I count calories when eating out? Pay attention to portions. Often, portions are much larger when eating out. Try these tips to keep portions smaller: Consider sharing a meal instead of getting your own. If you get your own meal, eat only half of it. Before you start eating, ask for a container and put half of your meal into it. When available, consider ordering smaller portions from the menu instead of full portions. Pay attention to your food and drink choices. Knowing the way food is cooked and what is included with the meal can help you eat fewer calories. If calories are listed on the menu, choose the lower-calorie options. Choose dishes that include vegetables, fruits, whole grains, low-fat dairy products, and lean proteins. Choose items that are boiled, broiled, grilled, or steamed. Avoid items that are buttered, battered, fried, or served with cream sauce. Items labeled as crispy are usually fried, unless stated otherwise. Choose water, low-fat milk,  unsweetened iced tea, or other drinks without added sugar. If you want an alcoholic beverage, choose a lower-calorie option, such as a glass of wine or light beer. Ask for dressings, sauces, and syrups on the side. These are usually high in calories, so you should limit the amount you eat. If you want a salad, choose a garden salad and ask for grilled meats. Avoid extra toppings such as bacon, cheese, or fried items. Ask for the dressing on the side, or ask for olive oil and vinegar or lemon to use as dressing. Estimate how many servings of a food you are given. Knowing serving sizes will help you be aware of how much food you are eating at restaurants. Where to find more information Centers for Disease Control and Prevention: www.cdc.gov U.[place]. Department of Agriculture: myplate.gov Summary Calorie counting means keeping track of how many calories you eat and drink each day. If you eat fewer calories than your body needs, you should lose weight. A healthy amount of weight to lose per week is usually 1-2 lb (0.5-0.9 kg). This usually means reducing your daily calorie intake by 500-750 calories. The number of calories in a food can be found on a Nutrition Facts label. If a food does not have a Nutrition Facts label, try to look up the calories online or ask your dietitian for help. Use smaller plates, glasses, and bowls for smaller portions and to prevent overeating. Use your calories on foods and drinks that will fill you up and not leave you hungry shortly after a meal. This information is not intended to replace advice given to you by your health care provider. Make sure you discuss any questions you have with your health care provider. Document Revised: 03/13/2019 Document Reviewed: 03/13/2019 Elsevier Patient Education  2023 Elsevier Inc.  

## 2022-12-27 LAB — HIV ANTIBODY (ROUTINE TESTING W REFLEX): HIV Screen 4th Generation wRfx: NONREACTIVE

## 2022-12-28 LAB — CERVICOVAGINAL ANCILLARY ONLY
Bacterial Vaginitis (gardnerella): NEGATIVE
Candida Glabrata: NEGATIVE
Candida Vaginitis: NEGATIVE
Chlamydia: NEGATIVE
Comment: NEGATIVE
Comment: NEGATIVE
Comment: NEGATIVE
Comment: NEGATIVE
Comment: NEGATIVE
Comment: NORMAL
Neisseria Gonorrhea: NEGATIVE
Trichomonas: NEGATIVE

## 2022-12-29 NOTE — Addendum Note (Signed)
Addended by: Herbert Deaner on: 12/29/2022 12:38 PM   Modules accepted: Orders

## 2023-03-20 ENCOUNTER — Telehealth (INDEPENDENT_AMBULATORY_CARE_PROVIDER_SITE_OTHER): Payer: Self-pay | Admitting: Primary Care

## 2023-03-20 NOTE — Telephone Encounter (Signed)
Called pt to remind them about atp. VM left

## 2023-03-22 ENCOUNTER — Ambulatory Visit (INDEPENDENT_AMBULATORY_CARE_PROVIDER_SITE_OTHER): Payer: Self-pay | Admitting: Primary Care

## 2023-03-27 ENCOUNTER — Telehealth (INDEPENDENT_AMBULATORY_CARE_PROVIDER_SITE_OTHER): Payer: Self-pay | Admitting: Primary Care

## 2023-03-27 ENCOUNTER — Ambulatory Visit (INDEPENDENT_AMBULATORY_CARE_PROVIDER_SITE_OTHER): Payer: Self-pay | Admitting: Primary Care

## 2023-03-27 NOTE — Telephone Encounter (Signed)
Called pt but pt did not answer. VM was left for pt.

## 2023-06-06 ENCOUNTER — Encounter (INDEPENDENT_AMBULATORY_CARE_PROVIDER_SITE_OTHER): Payer: Self-pay | Admitting: Primary Care

## 2023-06-29 ENCOUNTER — Ambulatory Visit (HOSPITAL_COMMUNITY): Payer: Self-pay

## 2023-08-20 ENCOUNTER — Encounter (INDEPENDENT_AMBULATORY_CARE_PROVIDER_SITE_OTHER): Payer: Self-pay | Admitting: Primary Care

## 2023-08-20 ENCOUNTER — Ambulatory Visit (INDEPENDENT_AMBULATORY_CARE_PROVIDER_SITE_OTHER): Payer: No Typology Code available for payment source | Admitting: Primary Care

## 2023-08-20 VITALS — BP 109/70 | HR 70 | Resp 16 | Ht 60.0 in | Wt 177.8 lb

## 2023-08-20 DIAGNOSIS — G43E09 Chronic migraine with aura, not intractable, without status migrainosus: Secondary | ICD-10-CM

## 2023-08-20 DIAGNOSIS — Z0000 Encounter for general adult medical examination without abnormal findings: Secondary | ICD-10-CM | POA: Diagnosis not present

## 2023-08-20 NOTE — Progress Notes (Signed)
 [facility]  Rebecca Reyes is a 23 y.o. female presents to office today for annual physical exam examination.    Concerns today include: 1.  She recently has been having headaches more often  then unusual for her.  She also has photosensitivity or nausea need to be laying down in a dark quiet room.  Occupation: Production designer, theatre/television/film , Marital status: Single, Substance use: None Diet: No known, Exercise: A little  Health Maintenance  Topic Date Due   HPV VACCINES (1 - 3-dose series) Never done   Meningococcal B Vaccine (1 of 2 - Standard) Never done   Hepatitis B Vaccines (1 of 3 - 19+ 3-dose series) Never done   COVID-19 Vaccine (1 - 2024-25 season) Never done   INFLUENZA VACCINE  09/14/2023   CHLAMYDIA SCREENING  12/26/2023   Cervical Cancer Screening (Pap smear)  05/30/2025   Hepatitis C Screening  Completed   HIV Screening  Completed   DTaP/Tdap/Td  Discontinued     Past Medical History:  Diagnosis Date   Anxiety    Social History   Socioeconomic History   Marital status: Single    Spouse name: Not on file   Number of children: Not on file   Years of education: Not on file   Highest education level: Some college, no degree  Occupational History   Not on file  Tobacco Use   Smoking status: Never   Smokeless tobacco: Never  Vaping Use   Vaping status: Never Used  Substance and Sexual Activity   Alcohol use: No   Drug use: Not Currently    Types: Marijuana   Sexual activity: Yes    Birth control/protection: None  Other Topics Concern   Not on file  Social History Narrative   Not on file   Social Drivers of Health   Financial Resource Strain: Medium Risk (08/19/2023)   Overall Financial Resource Strain (CARDIA)    Difficulty of Paying Living Expenses: Somewhat hard  Food Insecurity: Food Insecurity Present (08/19/2023)   Hunger Vital Sign    Worried About Running Out of Food in the Last Year: Sometimes true    Ran Out of Food in the Last Year: Often  true  Transportation Needs: No Transportation Needs (08/19/2023)   PRAPARE - Administrator, Civil Service (Medical): No    Lack of Transportation (Non-Medical): No  Physical Activity: Sufficiently Active (08/19/2023)   Exercise Vital Sign    Days of Exercise per Week: 4 days    Minutes of Exercise per Session: 150+ min  Stress: Stress Concern Present (08/19/2023)   [employer] of Occupational Health - Occupational Stress Questionnaire    Feeling of Stress: To some extent  Social Connections: Moderately Integrated (08/19/2023)   Social Connection and Isolation Panel    Frequency of Communication with Friends and Family: More than three times a week    Frequency of Social Gatherings with Friends and Family: Once a week    Attends Religious Services: More than 4 times per year    Active Member of [employer] or Organizations: No    Attends Banker Meetings: Not on file    Marital Status: Living with partner  Intimate Partner Violence: Not At Risk (12/26/2022)   Humiliation, Afraid, Rape, and Kick questionnaire    Fear of Current or Ex-Partner: No    Emotionally Abused: No    Physically Abused: No    Sexually Abused: No   No past surgical history on file. Family  History  Problem Relation Age of Onset   Bipolar disorder Maternal Aunt    Schizophrenia Maternal Aunt    Drug abuse Maternal Aunt    Bipolar disorder Maternal Uncle    Bipolar disorder Maternal Grandmother    Schizophrenia Maternal Grandmother     Current Outpatient Medications:    hydrOXYzine  (ATARAX ) 10 MG tablet, Take 1 tablet (10 mg total) by mouth every 8 (eight) hours as needed for anxiety (or sleep)., Disp: 60 tablet, Rfl: 2 Outpatient Encounter Medications as of 08/20/2023  Medication Sig   hydrOXYzine  (ATARAX ) 10 MG tablet Take 1 tablet (10 mg total) by mouth every 8 (eight) hours as needed for anxiety (or sleep).   No facility-administered encounter medications on file as of 08/20/2023.    No  Known Allergies   ROS: Review of Systems Pertinent items noted in HPI and remainder of comprehensive ROS otherwise negative.    Physical exam Physical exam: General: Vital signs reviewed.  Patient is well-developed and well-nourished,obese in no acute distress and cooperative with exam. Head: Normocephalic and atraumatic. Eyes: EOMI, conjunctivae normal, no scleral icterus. Neck: Supple, trachea midline, normal ROM, no JVD, masses, thyromegaly, or carotid bruit present. Cardiovascular: RRR, S1 normal, S2 normal, no murmurs, gallops, or rubs. Pulmonary/Chest: Clear to auscultation bilaterally, no wheezes, rales, or rhonchi. Abdominal: Soft, non-tender, non-distended, BS +, no masses, organomegaly, or guarding present. Musculoskeletal: No joint deformities, erythema, or stiffness, ROM full and nontender. Extremities: No lower extremity edema bilaterally,  pulses symmetric and intact bilaterally. No cyanosis or clubbing. Neurological: A&O x3, Strength is normal Skin: Warm, dry and intact. No rashes or erythema. Psychiatric: Normal mood and affect. speech and behavior is normal. Cognition and memory are normal.       Assessment/ Plan:  Rebecca Reyes was seen today for headache and annual exam.  Diagnoses and all orders for this visit:  Annual physical exam  Chronic migraine with aura without status migrainosus, not intractable Referred to neurology   Counseled on healthy lifestyle choices, including diet (rich in fruits, vegetables and lean meats and low in salt and simple carbohydrates) and exercise (at least 30 minutes of moderate physical activity daily).  Patient to follow up in 1 year for annual exam or sooner if needed.  The above assessment and management plan was discussed with the patient. The patient verbalized understanding of and has agreed to the management plan. Patient is aware to call the clinic if symptoms persist or worsen. Patient is aware when to return to the clinic for  a follow-up visit. Patient educated on when it is appropriate to go to the emergency department.   This note has been created with Education officer, environmental. Any transcriptional errors are unintentional.   Rebecca SHAUNNA Bohr, NP 08/20/2023, 11:34 AM

## 2023-08-27 ENCOUNTER — Ambulatory Visit (INDEPENDENT_AMBULATORY_CARE_PROVIDER_SITE_OTHER): Payer: Self-pay | Admitting: Primary Care

## 2023-09-10 ENCOUNTER — Encounter: Payer: Self-pay | Admitting: Neurology

## 2023-09-13 ENCOUNTER — Telehealth (INDEPENDENT_AMBULATORY_CARE_PROVIDER_SITE_OTHER): Payer: Self-pay | Admitting: Primary Care

## 2023-09-13 NOTE — Telephone Encounter (Signed)
 Called pt to confirm appt. Pt did not answer but LVM.

## 2023-09-14 ENCOUNTER — Ambulatory Visit (INDEPENDENT_AMBULATORY_CARE_PROVIDER_SITE_OTHER): Admitting: Primary Care

## 2023-09-14 ENCOUNTER — Other Ambulatory Visit: Payer: Self-pay

## 2023-09-14 ENCOUNTER — Encounter (INDEPENDENT_AMBULATORY_CARE_PROVIDER_SITE_OTHER): Payer: Self-pay | Admitting: Primary Care

## 2023-09-14 VITALS — BP 121/87 | HR 76 | Resp 16 | Wt 177.6 lb

## 2023-09-14 DIAGNOSIS — Z7689 Persons encountering health services in other specified circumstances: Secondary | ICD-10-CM

## 2023-09-14 DIAGNOSIS — Z6834 Body mass index (BMI) 34.0-34.9, adult: Secondary | ICD-10-CM | POA: Diagnosis not present

## 2023-09-14 MED ORDER — PHENTERMINE HCL 37.5 MG PO CAPS
37.5000 mg | ORAL_CAPSULE | ORAL | 1 refills | Status: DC
  Filled 2023-09-14: qty 30, 30d supply, fill #0

## 2023-09-14 NOTE — Progress Notes (Signed)
           [facility]   Lincy Belles, is a 23 y.o. female presents for weight management last year we started this conversation and due to upcoming festivities and holidays she decided today was the day that she will come in to discuss weight loss.  She is concerned about being 23 and obese she has family members diabetes which she is concerned of not having. BMI is 34.6 considered  obese.  Patient voices she has tried diet, exercising monitoring what and how much she eats and frustrated with no results.  EKG completed NSR will start on phentermine  and discuss associated problems palpitations, anxiety, trouble sleeping and can cause blood pressure to become elevated.  She is going to inform PCP if any of these signs and symptoms occur and stop the medication.   BP Readings from Last 3 Encounters:  09/14/23 121/87  08/20/23 109/70  12/26/22 111/74    Wt Readings from Last 3 Encounters:  09/14/23 177 lb 9.6 oz (80.6 kg)  08/20/23 177 lb 12.8 oz (80.6 kg)  12/26/22 172 lb (78 kg)    Medications: Current Outpatient Medications on File Prior to Visit  Medication Sig Dispense Refill   hydrOXYzine  (ATARAX ) 10 MG tablet Take 1 tablet (10 mg total) by mouth every 8 (eight) hours as needed for anxiety (or sleep). 60 tablet 2   No current facility-administered medications on file prior to visit.    ROS:   Denies any headaches, blurred vision, fatigue, shortness of breath, chest pain, abdominal pain, abnormal vaginal discharge/itching/odor/irritation, problems with periods, bowel movements, urination, or intercourse unless otherwise stated above.  Physical exam:  Vitals:   09/14/23 1032  BP: 121/87  Pulse: 76  Resp: 16  SpO2: 99%    BP 121/87   Pulse 76   Resp 16   Wt 177 lb 9.6 oz (80.6 kg)   LMP 08/14/2023   SpO2 99%   BMI 34.69 kg/m  General appearance: alert, cooperative, appears stated age, and no distress Head: Normocephalic, without obvious abnormality,  atraumatic Nose: Nares normal. Septum midline. Mucosa normal. No drainage or sinus tenderness. Lungs: clear to auscultation bilaterally Heart:  Abdomen: soft, non-tender; bowel sounds normal; no masses,  no organomegaly Skin: Skin color, texture, turgor normal. No rashes or lesions  Saloni was seen today for weight management screening.  Diagnoses and all orders for this visit:  Encounter for weight management Abnormal d/w cardiology as long as Bp is controlled phentermine  is acceptable to use  -     EKG 12-Lead -     CBC with Differential/Platelet; Future -     CMP14+EGFR; Future -     Lipid panel; Future  Other orders -     phentermine  37.5 MG capsule; Take 1 capsule (37.5 mg total) by mouth every morning.    Assessment and Plan: Obesity with co morbid conditions.  General weight loss/lifestyle modification strategies discussed (elicit support from others; identify saboteurs; non-food rewards, etc). Informal exercise measures discussed, e.g. taking stairs instead of elevator. Medication: phentermine . Follow up in: 52month  and as needed.   This note has been created with Education officer, environmental. Any transcriptional errors are unintentional.   Rosaline SHAUNNA Bohr, NP 09/14/2023, 10:51 AM

## 2023-09-20 ENCOUNTER — Ambulatory Visit: Payer: Self-pay

## 2023-09-20 NOTE — Telephone Encounter (Signed)
 FYI Only or Action Required?: Action required by provider: clinical question for provider.  Patient was last seen in primary care on 09/14/2023 by Celestia Rosaline SQUIBB, NP.  Called Nurse Triage reporting Medication Problem.  Symptoms began today.  Interventions attempted: Nothing.  Symptoms are: unchanged.  Triage Disposition: No disposition on file.  Copied from CRM [id number]. Topic: Clinical - Red Word Triage >> Sep 20, 2023  3:10 PM Mia F wrote: Red Word that prompted transfer to Nurse Triage: Pt has started phentermine  37.5 MG capsule recently and when she took the first dose she almost passed out. She says at that she thought it may have been because she did not eat that day. But now, she is still experiencing the same symptoms since. She also gets hot flashes and feels pin and needles in her arms. Reason for Disposition  [1] Caller has URGENT medicine question about med that primary care doctor (or NP/PA) or specialist prescribed AND [2] triager unable to answer question  Answer Assessment - Initial Assessment Questions 1. NAME of MEDICINE: What medicine(s) are you calling about?     phentermine  2. QUESTION: What is your question? (e.g., double dose of medicine, side effect)     Feels like she is having a bad rxn to the medication 3. PRESCRIBER: Who prescribed the medicine? Reason: if prescribed by specialist, call should be referred to that group.     PCP 4. SYMPTOMS: Do you have any symptoms? If Yes, ask: What symptoms are you having?  How bad are the symptoms (e.g., mild, moderate, severe)     Pins and needles BUE started after taking the dose at noon today, states first day didn't eat and felt like she was going to pass out 5. PREGNANCY:  Is there any chance that you are pregnant? When was your last menstrual period?     Denies  Pt states she would like to stop taking the medication and would like to know what else she could take?  Protocols used: Medication  Question Call-A-AH

## 2023-10-11 ENCOUNTER — Ambulatory Visit (INDEPENDENT_AMBULATORY_CARE_PROVIDER_SITE_OTHER): Admitting: Primary Care

## 2023-10-17 ENCOUNTER — Telehealth (INDEPENDENT_AMBULATORY_CARE_PROVIDER_SITE_OTHER): Payer: Self-pay | Admitting: Primary Care

## 2023-10-17 NOTE — Telephone Encounter (Signed)
 Left VM with pt about their upcoming appt.

## 2023-10-18 ENCOUNTER — Ambulatory Visit (INDEPENDENT_AMBULATORY_CARE_PROVIDER_SITE_OTHER): Admitting: Primary Care

## 2023-11-08 ENCOUNTER — Ambulatory Visit (INDEPENDENT_AMBULATORY_CARE_PROVIDER_SITE_OTHER): Admitting: Primary Care

## 2023-11-22 ENCOUNTER — Other Ambulatory Visit: Payer: Self-pay | Admitting: Medical Genetics

## 2023-11-29 ENCOUNTER — Ambulatory Visit (INDEPENDENT_AMBULATORY_CARE_PROVIDER_SITE_OTHER): Admitting: Primary Care

## 2023-11-29 ENCOUNTER — Encounter (INDEPENDENT_AMBULATORY_CARE_PROVIDER_SITE_OTHER): Payer: Self-pay | Admitting: Primary Care

## 2023-11-29 VITALS — BP 113/75 | HR 100 | Ht 60.0 in | Wt 175.0 lb

## 2023-11-29 DIAGNOSIS — Z6834 Body mass index (BMI) 34.0-34.9, adult: Secondary | ICD-10-CM

## 2023-11-29 DIAGNOSIS — Z713 Dietary counseling and surveillance: Secondary | ICD-10-CM | POA: Diagnosis not present

## 2023-11-29 DIAGNOSIS — E66811 Obesity, class 1: Secondary | ICD-10-CM | POA: Diagnosis not present

## 2023-11-29 DIAGNOSIS — E6609 Other obesity due to excess calories: Secondary | ICD-10-CM

## 2023-11-29 DIAGNOSIS — Z7689 Persons encountering health services in other specified circumstances: Secondary | ICD-10-CM

## 2023-12-14 ENCOUNTER — Encounter (INDEPENDENT_AMBULATORY_CARE_PROVIDER_SITE_OTHER): Payer: Self-pay | Admitting: Primary Care

## 2023-12-14 NOTE — Progress Notes (Signed)
           [facility]   Rebecca Reyes, is a 23 y.o. female presents for a follow up after starting on phentermine ,  for weight loss for 2 months. Patient states they have improved meal pattern, more consistent meal timing, fewer sweetened foods & beverages, and watch portion sizes/amount of food eaten at one time. While on the phentermine   they have lost 2 lbs since last visit. They deny palpitations, anxiety, trouble sleeping, elevated BP.   BP Readings from Last 3 Encounters:  11/29/23 113/75  09/14/23 121/87  08/20/23 109/70    Wt Readings from Last 3 Encounters:  11/29/23 175 lb (79.4 kg)  09/14/23 177 lb 9.6 oz (80.6 kg)  08/20/23 177 lb 12.8 oz (80.6 kg)    Medications: Current Outpatient Medications on File Prior to Visit  Medication Sig Dispense Refill   hydrOXYzine  (ATARAX ) 10 MG tablet Take 1 tablet (10 mg total) by mouth every 8 (eight) hours as needed for anxiety (or sleep). 60 tablet 2   phentermine  37.5 MG capsule Take 1 capsule (37.5 mg total) by mouth every morning. (Patient not taking: Reported on 11/29/2023) 30 capsule 1   No current facility-administered medications on file prior to visit.    ROS:   Denies any headaches, blurred vision, fatigue, shortness of breath, chest pain, abdominal pain, abnormal vaginal discharge/itching/odor/irritation, problems with periods, bowel movements, urination, or intercourse unless otherwise stated above.  Physical exam:  Vitals:   11/29/23 1605  BP: 113/75  Pulse: 100  SpO2: 99%   Physical exam: General: Vital signs reviewed.  Patient is well-developed and well-nourished, xx in no acute distress and cooperative with exam. Head: Normocephalic and atraumatic. Eyes: EOMI, conjunctivae normal, no scleral icterus. Neck: Supple, trachea midline, normal ROM, no JVD, masses, thyromegaly, or carotid bruit present. Cardiovascular: RRR, S1 normal, S2 normal, no murmurs, gallops, or rubs. Pulmonary/Chest: Clear  to auscultation bilaterally, no wheezes, rales, or rhonchi. Abdominal: Soft, non-tender, non-distended, BS +, no masses, organomegaly, or guarding present. Musculoskeletal: No joint deformities, erythema, or stiffness, ROM full and nontender. Extremities: No lower extremity edema bilaterally,  pulses symmetric and intact bilaterally.  Neurological: A&O x3, Strength is normal Skin: Warm, dry and intact. No rashes or erythema. Psychiatric: Normal mood and affect. speech and behavior is normal. Cognition and memory are normal.    Assessment and Plan: Rebecca Reyes was seen today for obesity.  Diagnoses and all orders for this visit:  Encounter for weight management 2/2 Class 1 obesity due to excess calories without serious comorbidity with body mass index (BMI) of 34.0 to 34.9 in adult  Obesity with co morbid conditions.  General weight loss/lifestyle modification strategies discussed (elicit support from others; identify saboteurs; non-food rewards, etc). Informal exercise measures discussed, e.g. taking stairs instead of elevator. Medication: phentermine . Follow up in: 2 months and as needed. Body mass index is 34.18 kg/m.   This note has been created with Education officer, environmental. Any transcriptional errors are unintentional.   Rebecca SHAUNNA Bohr, NP 12/14/2023, 2:35 PM

## 2024-01-13 NOTE — Progress Notes (Unsigned)
 NEUROLOGY CONSULTATION NOTE  Rebecca Reyes MRN: [id number] DOB: 13-Feb-2001  Referring provider: Rosaline Bohr, NP Primary care provider: Rosaline Bohr, NP  Reason for consult:  migraines  Assessment/Plan:   Migraine without aura, without status migrainosus, intractable  Migraine prevention:  Start topiramate 25mg  at bedtime.  We can increase to 50mg  at bedtime in 4 weeks if needed Migraine rescue:  Sumatriptan 100mg ; Zofran  4mg  for nausea Lifestyle modification: Limit use of pain relievers to no more than 9 days out of the month to prevent risk of rebound or medication-overuse headache. Diet modification/hydration/caffeine cessation Routine exercise Sleep hygiene Consider vitamins/supplements:  magnesium citrate 400mg  daily, riboflavin 400mg  daily, CoQ10 100mg  three times daily Keep headache diary Follow up 7 months.    Subjective:  Rebecca Reyes is a 23 year old right-handed female who presents for migraines.  History supplemented by referring provider's note.  Onset:  New headache.  May 2025, no precipitating factor Location:  Left sided (left retro-orbital) Quality:  pounding Intensity:  7/10 average.  Aura:  absent Prodrome:  absent Associated symptoms:  Nausea, photophobia, phonophobia, sometimes vomiting.  She denies associated visual disturbance, autonomic symptoms, unilateral numbness or weakness. Duration:  on and off 1 day (up to 4-5 days) Frequency:  every 2 days (14 days a month) Frequency of abortive medication: at least 2 to 3 days a week Triggers:  sodium Relieving factors:  resting in dark room with cold/hot compress over the eye Activity:  aggravates (can't function 4 days out of the month/once a week)  She does have a prior history of migraines when she was on birth control.  They were different, on the top of her head, throbbing, with nausea, photophobia and phonophobia but more manageable than current migraines.    Past NSAIDS/analgesics:   naproxen, Tylenol, Excedrin Past abortive triptans:  none Past abortive ergotamine:  none Past muscle relaxants:  none Past anti-emetic:  Zofran -ODT 4mg  Past antihypertensive medications:  none Past antidepressant medications:  venlafaxine , escitalopram  Past anticonvulsant medications:  none Past anti-CGRP:  none Past vitamins/Herbal/Supplements:  none Past antihistamines/decongestants:  meclizine  Other past therapies:  none  Current NSAIDS/analgesics:  ibuprofen  Current triptans:  none Current ergotamine:  none Current anti-emetic:  none Current muscle relaxants:  none Current Antihypertensive medications:  none Current Antidepressant medications:  none Current Anticonvulsant medications:  none Current anti-CGRP:  none Current Vitamins/Herbal/Supplements:  none Current Antihistamines/Decongestants:  none Other therapy:  none Birth control:  none    Caffeine:  Frappe McDonald less than once a week Alcohol:  occasionally Smoker:  no Diet:  does not drink enough water.  Chicken, potatoes, broccolli  Exercise:  sometimes walks Depression:  no; Anxiety:  yes Sleep hygiene:  variable  History of TBI/concussion:  hit in face with softball in 8th grade, minor Family history of headache:  no Family history of cerebral aneurysm:  no      PAST MEDICAL HISTORY: Past Medical History:  Diagnosis Date   Anxiety     PAST SURGICAL HISTORY: No past surgical history on file.  MEDICATIONS: Current Outpatient Medications on File Prior to Visit  Medication Sig Dispense Refill   hydrOXYzine  (ATARAX ) 10 MG tablet Take 1 tablet (10 mg total) by mouth every 8 (eight) hours as needed for anxiety (or sleep). 60 tablet 2   phentermine  37.5 MG capsule Take 1 capsule (37.5 mg total) by mouth every morning. (Patient not taking: Reported on 11/29/2023) 30 capsule 1   No current facility-administered medications on file prior to  visit.    ALLERGIES: No Known Allergies  FAMILY  HISTORY: Family History  Problem Relation Age of Onset   Bipolar disorder Maternal Aunt    Schizophrenia Maternal Aunt    Drug abuse Maternal Aunt    Bipolar disorder Maternal Uncle    Bipolar disorder Maternal Grandmother    Schizophrenia Maternal Grandmother     Objective:  Blood pressure 112/74, pulse 70, height 5' (1.524 m), weight 172 lb (78 kg), SpO2 99%. General: No acute distress.  Patient appears well-groomed.   Head:  Normocephalic/atraumatic Eyes:  fundi examined but not visualized Neck: supple, no paraspinal tenderness, full range of motion Heart: regular rate and rhythm Neurological Exam: Mental status: alert and oriented to person, place, and time, speech fluent and not dysarthric, language intact. Cranial nerves: CN I: not tested CN II: pupils equal, round and reactive to light, visual fields intact CN III, IV, VI:  full range of motion, no nystagmus, no ptosis CN V: facial sensation intact. CN VII: upper and lower face symmetric CN VIII: hearing intact CN IX, X: gag intact, uvula midline CN XI: sternocleidomastoid and trapezius muscles intact CN XII: tongue midline Bulk & Tone: normal, no fasciculations. Motor:  muscle strength 5/5 throughout Sensation:  Pinprick and vibratory sensation intact. Deep Tendon Reflexes:  2+ throughout,  toes downgoing.   Finger to nose testing:  Without dysmetria.   Gait:  Normal station and stride.  Romberg negative.    Thank you for allowing me to take part in the care of this patient.  Juliene Dunnings, DO  CC: Rosaline Bohr, NP

## 2024-01-14 ENCOUNTER — Ambulatory Visit: Admitting: Neurology

## 2024-01-14 ENCOUNTER — Encounter: Payer: Self-pay | Admitting: Neurology

## 2024-01-14 VITALS — BP 112/74 | HR 70 | Ht 60.0 in | Wt 172.0 lb

## 2024-01-14 DIAGNOSIS — G43019 Migraine without aura, intractable, without status migrainosus: Secondary | ICD-10-CM

## 2024-01-14 MED ORDER — SUMATRIPTAN SUCCINATE 100 MG PO TABS: 100.0000 mg | ORAL_TABLET | ORAL | 5 refills | Status: DC | PRN

## 2024-01-14 MED ORDER — ONDANSETRON 4 MG PO TBDP: 4.0000 mg | ORAL_TABLET | Freq: Three times a day (TID) | ORAL | 5 refills | Status: AC | PRN

## 2024-01-14 MED ORDER — TOPIRAMATE 25 MG PO TABS: 25.0000 mg | ORAL_TABLET | Freq: Every day | ORAL | 5 refills | Status: AC

## 2024-01-14 NOTE — Patient Instructions (Addendum)
  Start TOPIRAMATE 25MG  AT BEDTIME.  Contact us  in 4 weeks with update and we can increase dose if needed. Take SUMATRIPTAN 100MG  at earliest onset of headache.  May repeat dose once in 2 hours if needed.  Maximum 2 tablets in 24 hours. Take ZOFRAN  for nausea Stop ibuprofen  Limit use of pain relievers to no more than 9 days out of the month.  These medications include acetaminophen, NSAIDs (ibuprofen /Advil /Motrin , naproxen/Aleve, triptans (Imitrex/sumatriptan), Excedrin, and narcotics.  This will help reduce risk of rebound headaches. Be aware of common food triggers:  - Caffeine:  coffee, black tea, cola, Mt. Dew  - Chocolate  - Dairy:  aged cheeses (brie, blue, cheddar, gouda, Parmasan, provolone, romano, Swiss, etc), chocolate milk, buttermilk, sour cream, limit eggs and yogurt  - Nuts, peanut butter  - Alcohol  - Cereals/grains:  FRESH breads (fresh bagels, sourdough, doughnuts), yeast productions  - Processed/canned/aged/cured meats (pre-packaged deli meats, hotdogs)  - MSG/glutamate:  soy sauce, flavor enhancer, pickled/preserved/marinated foods  - Sweeteners:  aspartame (Equal, Nutrasweet).  Sugar and Splenda are okay  - Vegetables:  legumes (lima beans, lentils, snow peas, fava beans, pinto peans, peas, garbanzo beans), sauerkraut, onions, olives, pickles  - Fruit:  avocados, bananas, citrus fruit (orange, lemon, grapefruit), mango  - Other:  Frozen meals, macaroni and cheese Routine exercise Stay adequately hydrated (aim for 64 oz water daily) Keep headache diary Maintain proper stress management Maintain proper sleep hygiene Do not skip meals Consider supplements:  magnesium citrate 400mg  daily, riboflavin 400mg  daily, coenzyme Q10 100mg  three times daily.

## 2024-01-21 ENCOUNTER — Encounter: Payer: Self-pay | Admitting: Neurology

## 2024-01-21 ENCOUNTER — Other Ambulatory Visit: Payer: Self-pay

## 2024-01-21 MED ORDER — RIZATRIPTAN BENZOATE 10 MG PO TABS
10.0000 mg | ORAL_TABLET | ORAL | 5 refills | Status: AC | PRN
  Filled 2024-01-21: qty 10, 25d supply, fill #0

## 2024-02-09 ENCOUNTER — Encounter (HOSPITAL_COMMUNITY): Payer: Self-pay

## 2024-02-09 ENCOUNTER — Ambulatory Visit (HOSPITAL_COMMUNITY)
Admission: EM | Admit: 2024-02-09 | Discharge: 2024-02-09 | Disposition: A | Discharge status: Home / Self Care | Attending: Family Medicine | Admitting: Family Medicine

## 2024-02-09 DIAGNOSIS — J069 Acute upper respiratory infection, unspecified: Secondary | ICD-10-CM

## 2024-02-09 LAB — POCT INFLUENZA A/B
Influenza A, POC: NEGATIVE
Influenza B, POC: NEGATIVE

## 2024-02-09 MED ORDER — ONDANSETRON 4 MG PO TBDP
ORAL_TABLET | ORAL | Status: AC
  Filled 2024-02-09: qty 1

## 2024-02-09 MED ORDER — ONDANSETRON 4 MG PO TBDP
4.0000 mg | ORAL_TABLET | Freq: Once | ORAL | Status: AC
  Administered 2024-02-09: 4 mg via ORAL

## 2024-02-09 MED ORDER — ONDANSETRON 4 MG PO TBDP: 4.0000 mg | ORAL_TABLET | Freq: Three times a day (TID) | ORAL | 0 refills | Status: DC | PRN

## 2024-02-09 MED ORDER — BENZONATATE 100 MG PO CAPS: 100.0000 mg | ORAL_CAPSULE | Freq: Three times a day (TID) | ORAL | 0 refills | Status: DC | PRN

## 2024-02-09 MED ORDER — IBUPROFEN 600 MG PO TABS: 600.0000 mg | ORAL_TABLET | Freq: Three times a day (TID) | ORAL | 0 refills | Status: AC | PRN

## 2024-02-09 NOTE — ED Triage Notes (Addendum)
 Sore throat, Emesis, headache, body aches, and cough onset this morning. Patient sister sick first and here being seen as well.   Patient took otc cold and flu with no relief. Had medication last around 1600 today. Patient states the main ingredient in the medication was acetaminophen.

## 2024-02-09 NOTE — ED Provider Notes (Signed)
 " [facility]    CSN: [id number] Arrival date & time: 02/09/24  1919      History   Chief Complaint Chief Complaint  Patient presents with   Cough    HPI CHRISSA Reyes is a 23 y.o. female.    Cough  Here for cough and congestion and fever.  Symptoms began today.  She has thrown up a few times today.  She is still nauseated.     Her sister is sick with similar symptoms  NKDA  Last menstrual cycle December 20 Past Medical History:  Diagnosis Date   Anxiety     Patient Active Problem List   Diagnosis Date Noted   Cannabis use disorder, mild, in sustained remission 06/22/2022   Generalized anxiety disorder with panic attacks 01/27/2022    History reviewed. No pertinent surgical history.  OB History   No obstetric history on file.      Home Medications    Prior to Admission medications  Medication Sig Start Date End Date Taking? Authorizing Provider  benzonatate  (TESSALON ) 100 MG capsule Take 1 capsule (100 mg total) by mouth 3 (three) times daily as needed for cough. 02/09/24  Yes Vonna Sharlet POUR, MD  ibuprofen  (ADVIL ) 600 MG tablet Take 1 tablet (600 mg total) by mouth every 8 (eight) hours as needed (pain). 02/09/24  Yes Vonna Sharlet POUR, MD  ondansetron  (ZOFRAN -ODT) 4 MG disintegrating tablet Take 1 tablet (4 mg total) by mouth every 8 (eight) hours as needed for up to 4 days for nausea or vomiting. 02/09/24 02/13/24 Yes Vonna Sharlet POUR, MD  rizatriptan  (MAXALT ) 10 MG tablet Take 1 tablet (10 mg total) by mouth as needed for migraine. May repeat in 2 hours if needed 01/21/24  Yes Jaffe, Adam R, DO  ondansetron  (ZOFRAN -ODT) 4 MG disintegrating tablet Take 1 tablet (4 mg total) by mouth every 8 (eight) hours as needed. 01/14/24   Skeet Juliene SAUNDERS, DO  topiramate  (TOPAMAX ) 25 MG tablet Take 1 tablet (25 mg total) by mouth at bedtime. 01/14/24   Skeet Juliene SAUNDERS, DO    Family History Family History  Problem Relation Age of Onset   Bipolar  disorder Maternal Aunt    Schizophrenia Maternal Aunt    Drug abuse Maternal Aunt    Bipolar disorder Maternal Uncle    Bipolar disorder Maternal Grandmother    Schizophrenia Maternal Grandmother     Social History Social History[1]   Allergies   Patient has no known allergies.   Review of Systems Review of Systems  Respiratory:  Positive for cough.      Physical Exam Triage Vital Signs ED Triage Vitals  Encounter Vitals Group     BP 02/09/24 2012 108/71     Girls Systolic BP Percentile --      Girls Diastolic BP Percentile --      Boys Systolic BP Percentile --      Boys Diastolic BP Percentile --      Pulse Rate 02/09/24 2012 98     Resp 02/09/24 2012 17     Temp 02/09/24 2012 (!) 101.6 F (38.7 C)     Temp Source 02/09/24 2012 Oral     SpO2 02/09/24 2012 100 %     Weight --      Height --      Head Circumference --      Peak Flow --      Pain Score 02/09/24 2011 7     Pain Loc --  Pain Education --      Exclude from Growth Chart --    No data found.  Updated Vital Signs BP 108/71 (BP Location: Right Arm)   Pulse 98   Temp (!) 101.6 F (38.7 C) (Oral)   Resp 17   LMP 02/02/2024 (Approximate)   SpO2 100%   Visual Acuity Right Eye Distance:   Left Eye Distance:   Bilateral Distance:    Right Eye Near:   Left Eye Near:    Bilateral Near:     Physical Exam Vitals reviewed.  Constitutional:      General: She is not in acute distress.    Appearance: She is not toxic-appearing.  HENT:     Right Ear: Tympanic membrane and ear canal normal.     Left Ear: Tympanic membrane and ear canal normal.     Nose: Congestion present.     Mouth/Throat:     Mouth: Mucous membranes are moist.     Comments: No erythema Eyes:     Extraocular Movements: Extraocular movements intact.     Conjunctiva/sclera: Conjunctivae normal.     Pupils: Pupils are equal, round, and reactive to light.  Cardiovascular:     Rate and Rhythm: Normal rate and regular rhythm.      Heart sounds: No murmur heard. Pulmonary:     Effort: Pulmonary effort is normal. No respiratory distress.     Breath sounds: No stridor. No wheezing, rhonchi or rales.  Musculoskeletal:     Cervical back: Neck supple.  Lymphadenopathy:     Cervical: No cervical adenopathy.  Skin:    Capillary Refill: Capillary refill takes less than 2 seconds.     Coloration: Skin is not jaundiced or pale.  Neurological:     General: No focal deficit present.     Mental Status: She is alert and oriented to person, place, and time.  Psychiatric:        Behavior: Behavior normal.      UC Treatments / Results  Labs (all labs ordered are listed, but only abnormal results are displayed) Labs Reviewed  POCT INFLUENZA A/B    EKG   Radiology No results found.  Procedures Procedures (including critical care time)  Medications Ordered in UC Medications  ondansetron  (ZOFRAN -ODT) disintegrating tablet 4 mg (4 mg Oral Given 02/09/24 2047)    Initial Impression / Assessment and Plan / UC Course  I have reviewed the triage vital signs and the nursing notes.  Pertinent labs & imaging results that were available during my care of the patient were reviewed by me and considered in my medical decision making (see chart for details).     Flu test is negative  Tessalon  Perles and Zofran  and ibuprofen  are sent to the pharmacy for symptoms and Zofran  is given here. Final Clinical Impressions(s) / UC Diagnoses   Final diagnoses:  Viral URI with cough     Discharge Instructions      The testing for flu is negative  Take benzonatate  100 mg, 1 tab every 8 hours as needed for cough.  Ondansetron  dissolved in the mouth every 8 hours as needed for nausea or vomiting. Clear liquids(water, gatorade/pedialyte, ginger ale/sprite, chicken broth/soup) and bland things(crackers/toast, rice, potato, bananas) to eat. Avoid acidic foods like lemon/lime/orange/tomato, and avoid greasy/spicy foods. We  gave you 1 dose of this medication here in the clinic  Take ibuprofen  600 mg--1 tab every 8 hours as needed for pain.     ED Prescriptions  Medication Sig Dispense Auth. Provider   ondansetron  (ZOFRAN -ODT) 4 MG disintegrating tablet Take 1 tablet (4 mg total) by mouth every 8 (eight) hours as needed for up to 4 days for nausea or vomiting. 10 tablet Vonna Sharlet POUR, MD   ibuprofen  (ADVIL ) 600 MG tablet Take 1 tablet (600 mg total) by mouth every 8 (eight) hours as needed (pain). 15 tablet Brynli Ollis, Sharlet POUR, MD   benzonatate  (TESSALON ) 100 MG capsule Take 1 capsule (100 mg total) by mouth 3 (three) times daily as needed for cough. 21 capsule Anaisha Mago K, MD      PDMP not reviewed this encounter.    [1]  Social History Tobacco Use   Smoking status: Never   Smokeless tobacco: Never  Vaping Use   Vaping status: Never Used  Substance Use Topics   Alcohol use: Yes    Comment: soically   Drug use: Not Currently    Types: Marijuana     Vonna Sharlet POUR, MD 02/09/24 2109  "

## 2024-02-09 NOTE — Discharge Instructions (Signed)
 The testing for flu is negative  Take benzonatate  100 mg, 1 tab every 8 hours as needed for cough.  Ondansetron  dissolved in the mouth every 8 hours as needed for nausea or vomiting. Clear liquids(water, gatorade/pedialyte, ginger ale/sprite, chicken broth/soup) and bland things(crackers/toast, rice, potato, bananas) to eat. Avoid acidic foods like lemon/lime/orange/tomato, and avoid greasy/spicy foods. We gave you 1 dose of this medication here in the clinic  Take ibuprofen  600 mg--1 tab every 8 hours as needed for pain.

## 2024-02-10 ENCOUNTER — Encounter (HOSPITAL_COMMUNITY): Payer: Self-pay

## 2024-02-10 ENCOUNTER — Ambulatory Visit (HOSPITAL_COMMUNITY)
Admission: EM | Admit: 2024-02-10 | Discharge: 2024-02-10 | Disposition: A | Discharge status: Home / Self Care | Attending: Nurse Practitioner | Admitting: Nurse Practitioner

## 2024-02-10 DIAGNOSIS — T7840XA Allergy, unspecified, initial encounter: Secondary | ICD-10-CM

## 2024-02-10 DIAGNOSIS — R051 Acute cough: Secondary | ICD-10-CM | POA: Diagnosis not present

## 2024-02-10 DIAGNOSIS — J069 Acute upper respiratory infection, unspecified: Secondary | ICD-10-CM | POA: Diagnosis not present

## 2024-02-10 LAB — POCT INFLUENZA A/B
Influenza A, POC: NEGATIVE
Influenza B, POC: NEGATIVE

## 2024-02-10 MED ORDER — PREDNISONE 20 MG PO TABS: 40.0000 mg | ORAL_TABLET | Freq: Every day | ORAL | 0 refills | Status: AC

## 2024-02-10 NOTE — ED Provider Notes (Signed)
 " [facility]    CSN: [id number] Arrival date & time: 02/10/24  1433      History   Chief Complaint Chief Complaint  Patient presents with   Allergic Reaction    HPI Rebecca Reyes is a 23 y.o. female.   Discussed the use of AI scribe software for clinical note transcription with the patient, who gave verbal consent to proceed.   The patient returns to urgent care one day after an initial visit for a viral respiratory illness. At that visit, the patient was diagnosed with a viral upper respiratory infection (URI) and was prescribed Zofran , ibuprofen , and Benzonatate  for symptom management, with flu testing negative. The patient reports taking the Tessalon  cough medicine once last night and again this morning. Approximately two hours after the first dose last night, the patient began experiencing generalized itching. This morning, about two hours after taking the second dose, the patient developed hives all over the body, along with sneezing and a sore throat. The hives resolved after taking Benadryl, and the patient reports that they have not returned.  The patient denies fever, chills, body aches, wheezing, shortness of breath, difficulty swallowing, or throat swelling. The patient continues to experience runny nose, nasal congestion, and a productive cough from the original viral illness but denies nausea and vomiting. The patient has been taking the prescribed Zofran  and ibuprofen  without any issues, as they have taken these medications in the past.  The following sections of the patient's history were reviewed and updated as appropriate: allergies, current medications, past family history, past medical history, past social history, past surgical history, and problem list.      Past Medical History:  Diagnosis Date   Anxiety     Patient Active Problem List   Diagnosis Date Noted   Cannabis use disorder, mild, in sustained remission 06/22/2022   Generalized anxiety  disorder with panic attacks 01/27/2022    History reviewed. No pertinent surgical history.  OB History   No obstetric history on file.      Home Medications    Prior to Admission medications  Medication Sig Start Date End Date Taking? Authorizing Provider  predniSONE  (DELTASONE ) 20 MG tablet Take 2 tablets (40 mg total) by mouth daily for 5 days. 02/10/24 02/15/24 Yes Iola Lukes, FNP  ibuprofen  (ADVIL ) 600 MG tablet Take 1 tablet (600 mg total) by mouth every 8 (eight) hours as needed (pain). 02/09/24   Vonna Sharlet POUR, MD  ondansetron  (ZOFRAN -ODT) 4 MG disintegrating tablet Take 1 tablet (4 mg total) by mouth every 8 (eight) hours as needed. 01/14/24   Skeet Juliene SAUNDERS, DO  rizatriptan  (MAXALT ) 10 MG tablet Take 1 tablet (10 mg total) by mouth as needed for migraine. May repeat in 2 hours if needed 01/21/24   Skeet Juliene SAUNDERS, DO  topiramate  (TOPAMAX ) 25 MG tablet Take 1 tablet (25 mg total) by mouth at bedtime. 01/14/24   Skeet Juliene SAUNDERS, DO    Family History Family History  Problem Relation Age of Onset   Bipolar disorder Maternal Aunt    Schizophrenia Maternal Aunt    Drug abuse Maternal Aunt    Bipolar disorder Maternal Uncle    Bipolar disorder Maternal Grandmother    Schizophrenia Maternal Grandmother     Social History Social History[1]   Allergies   Tessalon  [benzonatate ]   Review of Systems Review of Systems  Constitutional:  Negative for chills and fever.  HENT:  Positive for sore throat. Negative for congestion, rhinorrhea  and sneezing.   Respiratory:  Positive for cough. Negative for shortness of breath and wheezing.   Gastrointestinal:  Negative for diarrhea, nausea and vomiting.  Musculoskeletal:  Positive for myalgias.  Skin:  Positive for rash.       Itching    Neurological:  Positive for headaches.  All other systems reviewed and are negative.    Physical Exam Triage Vital Signs ED Triage Vitals  Encounter Vitals Group     BP 02/10/24 1522  115/77     Girls Systolic BP Percentile --      Girls Diastolic BP Percentile --      Boys Systolic BP Percentile --      Boys Diastolic BP Percentile --      Pulse Rate 02/10/24 1522 69     Resp 02/10/24 1522 16     Temp 02/10/24 1522 98.6 F (37 C)     Temp Source 02/10/24 1522 Oral     SpO2 02/10/24 1522 98 %     Weight --      Height --      Head Circumference --      Peak Flow --      Pain Score 02/10/24 1519 0     Pain Loc --      Pain Education --      Exclude from Growth Chart --    No data found.  Updated Vital Signs BP 115/77 (BP Location: Left Arm)   Pulse 69   Temp 98.6 F (37 C) (Oral)   Resp 16   LMP 02/02/2024 (Approximate)   SpO2 98%   Visual Acuity Right Eye Distance:   Left Eye Distance:   Bilateral Distance:    Right Eye Near:   Left Eye Near:    Bilateral Near:     Physical Exam Vitals reviewed.  Constitutional:      General: She is awake. She is not in acute distress.    Appearance: Normal appearance. She is well-developed. She is not ill-appearing, toxic-appearing or diaphoretic.  HENT:     Head: Normocephalic.     Right Ear: Hearing, tympanic membrane, ear canal and external ear normal.     Left Ear: Hearing, tympanic membrane, ear canal and external ear normal.     Nose: Congestion present.     Mouth/Throat:     Lips: Pink.     Mouth: Mucous membranes are moist.     Pharynx: Oropharynx is clear. Uvula midline. No pharyngeal swelling, oropharyngeal exudate, posterior oropharyngeal erythema or uvula swelling.     Tonsils: No tonsillar exudate or tonsillar abscesses.  Eyes:     General: Vision grossly intact.     Conjunctiva/sclera: Conjunctivae normal.  Neck:     Trachea: Phonation normal.  Cardiovascular:     Rate and Rhythm: Normal rate.     Heart sounds: Normal heart sounds.  Pulmonary:     Effort: Pulmonary effort is normal. No tachypnea or respiratory distress.     Breath sounds: Normal breath sounds and air entry.   Musculoskeletal:        General: Normal range of motion.     Cervical back: Full passive range of motion without pain, normal range of motion and neck supple.  Lymphadenopathy:     Cervical: No cervical adenopathy.  Skin:    General: Skin is warm and dry.     Findings: No rash.  Neurological:     General: No focal deficit present.     Mental Status:  She is alert and oriented to person, place, and time.  Psychiatric:        Behavior: Behavior is cooperative.      UC Treatments / Results  Labs (all labs ordered are listed, but only abnormal results are displayed) Labs Reviewed  POCT INFLUENZA A/B    EKG   Radiology No results found.  Procedures Procedures (including critical care time)  Medications Ordered in UC Medications - No data to display  Initial Impression / Assessment and Plan / UC Course  I have reviewed the triage vital signs and the nursing notes.  Pertinent labs & imaging results that were available during my care of the patient were reviewed by me and considered in my medical decision making (see chart for details).    The patient presents with an allergic reaction to benzonatate  that began approximately two hours after the first dose, with generalized pruritus followed by diffuse hives after the second dose this morning. There was no associated throat swelling, difficulty swallowing, shortness of breath, or other signs of anaphylaxis. Symptoms improved after the patient took diphenhydramine, and hives have not recurred. Benzonatate  was added to the patients allergy list, and she was instructed to discontinue the medication immediately and discard any remaining doses. Treatment was initiated with a short course of prednisone , and the patient was advised to continue diphenhydramine as needed for residual itching. She may continue ondansetron  and ibuprofen  600 mg as previously prescribed. The patient also continues to have symptoms consistent with a viral upper  respiratory illness, including cough and nasal congestion, with negative influenza testing on consecutive days. Supportive care was advised, and the prescribed prednisone  is expected to provide additional benefit by reducing upper airway inflammation. The patient was counseled on strict return precautions and advised to seek emergency care immediately for throat swelling, difficulty breathing, lip or tongue swelling, dizziness, or any other signs concerning for anaphylaxis, and to follow up with her primary care provider if symptoms persist or worsen.  Today's evaluation has revealed no signs of a dangerous process. Discussed diagnosis with patient and/or guardian. Patient and/or guardian aware of their diagnosis, possible red flag symptoms to watch out for and need for close follow up. Patient and/or guardian understands verbal and written discharge instructions. Patient and/or guardian comfortable with plan and disposition.  Patient and/or guardian has a clear mental status at this time, good insight into illness (after discussion and teaching) and has clear judgment to make decisions regarding their care  Documentation was completed with the aid of voice recognition software. Transcription may contain typographical errors.   Final Clinical Impressions(s) / UC Diagnoses   Final diagnoses:  Acute cough  Allergic reaction, initial encounter  Viral upper respiratory tract infection     Discharge Instructions      You were seen today for an allergic reaction to Tessalon  (benzonatate ), which was prescribed for your cough. After taking the medication, you developed itching and hives, which improved after taking Benadryl. This medication should be stopped immediately and should not be taken again. It has been added to your allergy list, and any remaining capsules should be discarded.  To help treat the allergic reaction, take the prescribed prednisone  exactly as directed. You may continue using  Benadryl as needed for any remaining itching or rash, but be aware that it can cause drowsiness. You may also continue taking ondansetron  and ibuprofen  as previously prescribed. For your ongoing viral cold symptoms, focus on supportive care such as drinking plenty of fluids, resting,  using a humidifier, and taking over-the-counter medications as needed for congestion, cough, body aches, or fever. The prednisone  should also help reduce inflammation related to your cough.  Follow up with your primary care provider if your symptoms do not continue to improve over the next several days or if you have ongoing issues with cough or medication reactions. Seek emergency care immediately if you develop swelling of the lips, tongue, or throat, trouble breathing or swallowing, chest tightness, dizziness, fainting, or if a rash or hives return and rapidly worsen, as these can be signs of a serious allergic reaction.     ED Prescriptions     Medication Sig Dispense Auth. Provider   predniSONE  (DELTASONE ) 20 MG tablet Take 2 tablets (40 mg total) by mouth daily for 5 days. 10 tablet Iola Lukes, FNP      PDMP not reviewed this encounter.      [1]  Social History Tobacco Use   Smoking status: Never   Smokeless tobacco: Never  Vaping Use   Vaping status: Never Used  Substance Use Topics   Alcohol use: Yes    Comment: socially   Drug use: Not Currently    Types: Marijuana     Iola Lukes, FNP 02/10/24 1800  "

## 2024-02-10 NOTE — Discharge Instructions (Addendum)
 You were seen today for an allergic reaction to Tessalon  (benzonatate ), which was prescribed for your cough. After taking the medication, you developed itching and hives, which improved after taking Benadryl. This medication should be stopped immediately and should not be taken again. It has been added to your allergy list, and any remaining capsules should be discarded.  To help treat the allergic reaction, take the prescribed prednisone  exactly as directed. You may continue using Benadryl as needed for any remaining itching or rash, but be aware that it can cause drowsiness. You may also continue taking ondansetron  and ibuprofen  as previously prescribed. For your ongoing viral cold symptoms, focus on supportive care such as drinking plenty of fluids, resting, using a humidifier, and taking over-the-counter medications as needed for congestion, cough, body aches, or fever. The prednisone  should also help reduce inflammation related to your cough.  Follow up with your primary care provider if your symptoms do not continue to improve over the next several days or if you have ongoing issues with cough or medication reactions. Seek emergency care immediately if you develop swelling of the lips, tongue, or throat, trouble breathing or swallowing, chest tightness, dizziness, fainting, or if a rash or hives return and rapidly worsen, as these can be signs of a serious allergic reaction.

## 2024-02-10 NOTE — ED Triage Notes (Addendum)
 Patient here today with c/o itchy hives, sneeze, and ST since last night. Patient was here last night and was given cough medicine and thinks that it may be from that. Patient took Benadryl about an hour ago with a lot of relief.

## 2024-08-13 ENCOUNTER — Ambulatory Visit: Admitting: Neurology
